# Patient Record
Sex: Male | Born: 1955 | Race: White | Hispanic: No | Marital: Married | Smoking: Former smoker
Health system: Southern US, Community
[De-identification: ages and names within clinical notes are randomized; demographics above are authoritative.]

## PROBLEM LIST (undated history)

## (undated) DIAGNOSIS — H9190 Unspecified hearing loss, unspecified ear: Secondary | ICD-10-CM

## (undated) DIAGNOSIS — I82409 Acute embolism and thrombosis of unspecified deep veins of unspecified lower extremity: Secondary | ICD-10-CM

## (undated) DIAGNOSIS — J449 Chronic obstructive pulmonary disease, unspecified: Secondary | ICD-10-CM

## (undated) DIAGNOSIS — J40 Bronchitis, not specified as acute or chronic: Secondary | ICD-10-CM

## (undated) DIAGNOSIS — I8390 Asymptomatic varicose veins of unspecified lower extremity: Secondary | ICD-10-CM

## (undated) DIAGNOSIS — I809 Phlebitis and thrombophlebitis of unspecified site: Secondary | ICD-10-CM

## (undated) HISTORY — PX: ELBOW SURGERY: SHX618

## (undated) HISTORY — DX: Phlebitis and thrombophlebitis of unspecified site: I80.9

## (undated) HISTORY — DX: Asymptomatic varicose veins of unspecified lower extremity: I83.90

## (undated) HISTORY — DX: Chronic obstructive pulmonary disease, unspecified: J44.9

## (undated) HISTORY — PX: OTHER SURGICAL HISTORY: SHX169

## (undated) HISTORY — DX: Unspecified hearing loss, unspecified ear: H91.90

## (undated) HISTORY — DX: Acute embolism and thrombosis of unspecified deep veins of unspecified lower extremity: I82.409

## (undated) HISTORY — DX: Bronchitis, not specified as acute or chronic: J40

---

## 2004-03-18 ENCOUNTER — Encounter: Admission: RE | Admit: 2004-03-18 | Discharge: 2004-03-18 | Payer: Self-pay | Admitting: Family Medicine

## 2006-03-05 ENCOUNTER — Ambulatory Visit: Payer: Self-pay | Admitting: Family Medicine

## 2006-04-23 ENCOUNTER — Encounter: Admission: RE | Admit: 2006-04-23 | Discharge: 2006-04-23 | Payer: Self-pay

## 2006-06-16 ENCOUNTER — Inpatient Hospital Stay (HOSPITAL_COMMUNITY): Admission: EM | Admit: 2006-06-16 | Discharge: 2006-06-19 | Payer: Self-pay

## 2006-06-16 ENCOUNTER — Ambulatory Visit: Payer: Self-pay | Admitting: Vascular Surgery

## 2006-06-16 ENCOUNTER — Ambulatory Visit (HOSPITAL_COMMUNITY): Admission: RE | Admit: 2006-06-16 | Discharge: 2006-06-16 | Payer: Self-pay | Admitting: General Surgery

## 2006-06-21 ENCOUNTER — Ambulatory Visit: Payer: Self-pay | Admitting: Family Medicine

## 2006-06-24 ENCOUNTER — Ambulatory Visit: Payer: Self-pay | Admitting: Family Medicine

## 2006-06-28 ENCOUNTER — Ambulatory Visit: Payer: Self-pay | Admitting: Family Medicine

## 2006-07-17 ENCOUNTER — Emergency Department (HOSPITAL_COMMUNITY): Admission: EM | Admit: 2006-07-17 | Discharge: 2006-07-17 | Payer: Self-pay | Admitting: Emergency Medicine

## 2006-07-22 ENCOUNTER — Ambulatory Visit: Payer: Self-pay | Admitting: Family Medicine

## 2006-09-16 ENCOUNTER — Ambulatory Visit: Payer: Self-pay | Admitting: Family Medicine

## 2006-10-27 ENCOUNTER — Ambulatory Visit: Payer: Self-pay | Admitting: Family Medicine

## 2006-12-17 ENCOUNTER — Ambulatory Visit: Payer: Self-pay | Admitting: Family Medicine

## 2007-02-07 ENCOUNTER — Ambulatory Visit: Payer: Self-pay | Admitting: Family Medicine

## 2007-02-10 ENCOUNTER — Ambulatory Visit: Payer: Self-pay | Admitting: Surgery

## 2007-02-10 ENCOUNTER — Ambulatory Visit (HOSPITAL_COMMUNITY): Admission: RE | Admit: 2007-02-10 | Discharge: 2007-02-10 | Payer: Self-pay | Admitting: Family Medicine

## 2007-02-10 ENCOUNTER — Encounter: Payer: Self-pay | Admitting: Family Medicine

## 2007-03-11 ENCOUNTER — Ambulatory Visit: Payer: Self-pay | Admitting: Family Medicine

## 2007-03-15 ENCOUNTER — Encounter: Payer: Self-pay | Admitting: Family Medicine

## 2007-03-15 ENCOUNTER — Ambulatory Visit: Payer: Self-pay | Admitting: Vascular Surgery

## 2007-03-15 ENCOUNTER — Ambulatory Visit (HOSPITAL_COMMUNITY): Admission: RE | Admit: 2007-03-15 | Discharge: 2007-03-15 | Payer: Self-pay | Admitting: Family Medicine

## 2007-03-15 ENCOUNTER — Ambulatory Visit: Payer: Self-pay | Admitting: Family Medicine

## 2007-03-23 ENCOUNTER — Ambulatory Visit: Payer: Self-pay | Admitting: Family Medicine

## 2007-04-07 ENCOUNTER — Ambulatory Visit: Payer: Self-pay | Admitting: Family Medicine

## 2007-04-25 ENCOUNTER — Ambulatory Visit: Payer: Self-pay | Admitting: Family Medicine

## 2007-05-02 ENCOUNTER — Ambulatory Visit: Payer: Self-pay | Admitting: Vascular Surgery

## 2007-05-09 ENCOUNTER — Ambulatory Visit: Payer: Self-pay | Admitting: Family Medicine

## 2007-05-25 ENCOUNTER — Ambulatory Visit: Payer: Self-pay | Admitting: Family Medicine

## 2007-06-10 ENCOUNTER — Ambulatory Visit: Payer: Self-pay | Admitting: Family Medicine

## 2007-07-08 ENCOUNTER — Ambulatory Visit: Payer: Self-pay | Admitting: Family Medicine

## 2007-08-08 ENCOUNTER — Ambulatory Visit: Payer: Self-pay | Admitting: Family Medicine

## 2007-08-22 ENCOUNTER — Ambulatory Visit: Payer: Self-pay | Admitting: Family Medicine

## 2007-09-06 ENCOUNTER — Ambulatory Visit: Payer: Self-pay | Admitting: Family Medicine

## 2007-09-21 ENCOUNTER — Ambulatory Visit: Payer: Self-pay | Admitting: Family Medicine

## 2007-10-17 ENCOUNTER — Ambulatory Visit: Payer: Self-pay | Admitting: Family Medicine

## 2007-12-20 ENCOUNTER — Ambulatory Visit: Payer: Self-pay | Admitting: Family Medicine

## 2008-01-23 ENCOUNTER — Ambulatory Visit: Payer: Self-pay | Admitting: Family Medicine

## 2008-02-27 ENCOUNTER — Ambulatory Visit: Payer: Self-pay | Admitting: Family Medicine

## 2008-03-05 ENCOUNTER — Ambulatory Visit: Payer: Self-pay | Admitting: Family Medicine

## 2008-03-05 ENCOUNTER — Encounter: Admission: RE | Admit: 2008-03-05 | Discharge: 2008-03-05 | Payer: Self-pay | Admitting: Family Medicine

## 2008-03-08 ENCOUNTER — Ambulatory Visit: Payer: Self-pay | Admitting: Family Medicine

## 2008-03-19 ENCOUNTER — Ambulatory Visit: Payer: Self-pay | Admitting: Family Medicine

## 2008-03-20 ENCOUNTER — Ambulatory Visit: Payer: Self-pay | Admitting: Vascular Surgery

## 2008-04-17 ENCOUNTER — Ambulatory Visit: Payer: Self-pay | Admitting: Family Medicine

## 2008-05-21 ENCOUNTER — Ambulatory Visit: Payer: Self-pay | Admitting: Family Medicine

## 2008-06-22 ENCOUNTER — Ambulatory Visit: Payer: Self-pay | Admitting: Family Medicine

## 2008-07-23 ENCOUNTER — Ambulatory Visit: Payer: Self-pay | Admitting: Family Medicine

## 2008-08-22 ENCOUNTER — Ambulatory Visit: Payer: Self-pay | Admitting: Family Medicine

## 2008-10-02 ENCOUNTER — Ambulatory Visit: Payer: Self-pay | Admitting: Family Medicine

## 2008-11-27 ENCOUNTER — Ambulatory Visit: Payer: Self-pay | Admitting: Family Medicine

## 2008-12-17 ENCOUNTER — Ambulatory Visit: Payer: Self-pay | Admitting: Family Medicine

## 2009-02-04 ENCOUNTER — Ambulatory Visit: Payer: Self-pay | Admitting: Family Medicine

## 2009-03-30 ENCOUNTER — Emergency Department (HOSPITAL_COMMUNITY): Admission: EM | Admit: 2009-03-30 | Discharge: 2009-03-30 | Payer: Self-pay | Admitting: Emergency Medicine

## 2009-04-01 ENCOUNTER — Ambulatory Visit: Payer: Self-pay | Admitting: Family Medicine

## 2009-04-16 ENCOUNTER — Ambulatory Visit: Payer: Self-pay | Admitting: Physician Assistant

## 2009-06-10 ENCOUNTER — Ambulatory Visit: Payer: Self-pay | Admitting: Family Medicine

## 2009-07-16 ENCOUNTER — Ambulatory Visit: Payer: Self-pay | Admitting: Family Medicine

## 2009-08-13 ENCOUNTER — Ambulatory Visit: Payer: Self-pay | Admitting: Family Medicine

## 2009-09-10 ENCOUNTER — Ambulatory Visit: Payer: Self-pay | Admitting: Family Medicine

## 2009-10-09 ENCOUNTER — Encounter: Admission: RE | Admit: 2009-10-09 | Discharge: 2009-10-09 | Payer: Self-pay | Admitting: Family Medicine

## 2009-10-09 ENCOUNTER — Ambulatory Visit: Payer: Self-pay | Admitting: Family Medicine

## 2009-10-09 ENCOUNTER — Encounter: Payer: Self-pay | Admitting: Internal Medicine

## 2009-12-25 ENCOUNTER — Ambulatory Visit: Payer: Self-pay | Admitting: Family Medicine

## 2010-01-14 ENCOUNTER — Ambulatory Visit: Payer: Self-pay | Admitting: Family Medicine

## 2010-03-03 ENCOUNTER — Ambulatory Visit: Payer: Self-pay | Admitting: Family Medicine

## 2010-03-12 ENCOUNTER — Ambulatory Visit: Payer: Self-pay | Admitting: Family Medicine

## 2010-04-14 ENCOUNTER — Ambulatory Visit: Payer: Self-pay | Admitting: Family Medicine

## 2010-04-16 ENCOUNTER — Encounter: Payer: Self-pay | Admitting: Internal Medicine

## 2010-04-16 ENCOUNTER — Encounter
Admission: RE | Admit: 2010-04-16 | Discharge: 2010-04-16 | Payer: Self-pay | Source: Home / Self Care | Attending: Family Medicine | Admitting: Family Medicine

## 2010-05-06 ENCOUNTER — Encounter: Payer: Self-pay | Admitting: Internal Medicine

## 2010-05-06 ENCOUNTER — Ambulatory Visit
Admission: RE | Admit: 2010-05-06 | Discharge: 2010-05-06 | Payer: Self-pay | Source: Home / Self Care | Attending: Family Medicine | Admitting: Family Medicine

## 2010-05-06 ENCOUNTER — Encounter
Admission: RE | Admit: 2010-05-06 | Discharge: 2010-05-06 | Payer: Self-pay | Source: Home / Self Care | Attending: Family Medicine | Admitting: Family Medicine

## 2010-05-19 ENCOUNTER — Emergency Department (HOSPITAL_COMMUNITY)
Admission: EM | Admit: 2010-05-19 | Discharge: 2010-05-20 | Payer: Self-pay | Source: Home / Self Care | Admitting: Emergency Medicine

## 2010-05-20 LAB — BASIC METABOLIC PANEL
BUN: 14 mg/dL (ref 6–23)
CO2: 25 mEq/L (ref 19–32)
Chloride: 108 mEq/L (ref 96–112)
Creatinine, Ser: 1.02 mg/dL (ref 0.4–1.5)
GFR calc Af Amer: >60 mL/min (ref >60)
GFR calc non Af Amer: >60 mL/min (ref >60)
Sodium: 143 mEq/L (ref 135–145)

## 2010-05-20 LAB — CBC
HCT: 45 % (ref 39.0–52.0)
MCHC: 33.8 g/dL (ref 30.0–36.0)
MCV: 87.4 fL (ref 78.0–100.0)
RDW: 12.8 % (ref 11.5–15.5)

## 2010-05-20 LAB — DIFFERENTIAL
Basophils Absolute: 0.1 10*3/uL (ref 0.0–0.1)
Basophils Relative: 1 % (ref 0–1)
Eosinophils Relative: 2 % (ref 0–5)
Lymphocytes Relative: 21 % (ref 12–46)
Lymphs Abs: 2.6 10*3/uL (ref 0.7–4.0)
Monocytes Absolute: 1 10*3/uL (ref 0.1–1.0)
Neutrophils Relative %: 69 % (ref 43–77)

## 2010-05-20 LAB — OCCULT BLOOD, POC DEVICE: Fecal Occult Bld: NEGATIVE

## 2010-06-23 ENCOUNTER — Ambulatory Visit (INDEPENDENT_AMBULATORY_CARE_PROVIDER_SITE_OTHER): Payer: BC Managed Care – PPO

## 2010-06-23 DIAGNOSIS — Z7901 Long term (current) use of anticoagulants: Secondary | ICD-10-CM

## 2010-07-03 ENCOUNTER — Encounter: Payer: Self-pay | Admitting: Internal Medicine

## 2010-07-03 ENCOUNTER — Institutional Professional Consult (permissible substitution) (INDEPENDENT_AMBULATORY_CARE_PROVIDER_SITE_OTHER): Payer: BC Managed Care – PPO | Admitting: Internal Medicine

## 2010-07-03 DIAGNOSIS — J42 Unspecified chronic bronchitis: Secondary | ICD-10-CM | POA: Insufficient documentation

## 2010-07-03 DIAGNOSIS — J309 Allergic rhinitis, unspecified: Secondary | ICD-10-CM

## 2010-07-06 DIAGNOSIS — Z77090 Contact with and (suspected) exposure to asbestos: Secondary | ICD-10-CM | POA: Insufficient documentation

## 2010-07-15 NOTE — Assessment & Plan Note (Signed)
Summary: SOB/ WHEEZING- REFER DR: Toniann Fail ///kp   Primary Provider/Referring Provider:  lalonde  CC:  Pulmonary Consult-SOB/wheezing; cough-clear; Dr. Susann Givens.Marland Kitchen  History of Present Illness: July 03, 2010- 55 yoM former smoker, seen w/ wife at kind request of Dr Susann Givens with complaint of cough x 9 months. He dates onset to an outpatient pneumonia in June, 2011. CXR 10/09/09-RUL infiltrate c/w pneumonia. Incidental small RLL nodule. CXR 04/16/10- clear; CXR 05/06/10- acute bronchitis.  Cough was intermittent after the pneumonia and largely resolved, but resumed in December at time of bronchitis changes on CXR in January. Phlegm usually clear, sometimes green, w/ no blood. Occasional wheeze. Z pak and tussionex temporary help. Denies GERD. 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 He avoids strong cleansers and odors where possible. Proair helps some. Denies chest pain, palpitation, change in exercise tolerance, fever, weight loss, nodes. Was cross country runner -remote. Deaf- reads lips. Rhinitis cutting grass- itch and sneeze, but not cough or wheeze.  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  Hx DVT w/o known PE, R leg, 2006. Says hypercoag/ Leiden w/u negative. Had vein strrippings. Continues Coumadin- no bleeding.   Preventive Screening-Counseling & Management  Alcohol-Tobacco     Alcohol drinks/day: 0     Smoking Status: quit     Packs/Day: 3.0     Year Started: 1992     Year Quit: 1994     Pack years: 6  Current Medications (verified): 1)  Proair Hfa 108 (90 Base) Mcg/act Aers (Albuterol Sulfate) .... 2 Puffs Four Times A Day As Needed For Sob 2)  Coumadin 7.5 Mg Tabs (Warfarin Sodium) .... Take 1 By Mouth On Tuesday, Thursday, Saturday, and "Sunday 3)  Alprazolam 0.5 Mg Tabs (Alprazolam) .... Take 1 By Mouth Once Daily As Needed  Allergies (verified): 1)  !  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 Passive exposure till wife quit                                                                     20" 10 No ETOHSmoking Status:  quit Alcohol drinks/day:  0 Packs/Day:  3.0 Pack years:  6  Review of Systems      See HPI       The patient complains of shortness of breath with activity, shortness of breath at rest, productive cough, non-productive cough, sore throat, and change in color of mucus.  The patient denies coughing up blood, chest pain, irregular heartbeats, acid heartburn, indigestion, loss of appetite, weight change, abdominal pain, difficulty swallowing, tooth/dental problems, headaches, nasal congestion/difficulty breathing through nose, sneezing, itching, ear ache, anxiety, depression, hand/feet swelling, joint stiffness or pain, rash, and fever.    Vital Signs:  Patient profile:   55  year old male Height:      75 inches Weight:      289 pounds BMI:     36.25 O2 Sat:      92 % on Room air Pulse rate:   61 / minute BP sitting:   132 / 84  (left arm) Cuff size:   large  Vitals Entered By: Reynaldo Minium CMA (July 03, 2010 9:07 AM)  O2 Flow:  Room air CC: Pulmonary Consult-SOB/wheezing; cough-clear; Dr. Susann Givens.   Physical Exam  Additional Exam:  General: A/Ox3; pleasant and cooperative, NAD, deaf- lip reader, overweight SKIN: no rash, lesions; tatoos NODES: no lymphadenopathy HEENT: [place]/AT, EOM- WNL, Conjuctivae- clear, PERRLA, TM-WNL, Nose- clear, Throat- clear and wnl, Mallampati   II NECK: Supple w/ fair ROM, JVD- none, normal carotid impulses w/o bruits Thyroid- normal to palpation CHEST: Clear to P&A HEART: RRR, no m/g/r heard ABDOMEN: Soft and nl; nml bowel sounds; no organomegaly or masses noted XBJ:YNWG, nl pulses,, Heavy legs, elastic hose  NEURO: Grossly intact to observation  -    Impression & Recommendations:  Problem # 1:  BRONCHITIS, CHRONIC (ICD-491.9)  Main trigger seems to be strong odors. 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 He has a hx of asbestos exposure , but nothing described of concern on CXR. To start, we will get PFT and try Advair with med talk.  Fortunately his wife has stopped smoking.  Problem # 2:  ALLERGIC RHINITIS (ICD-477.9)  He recognizes the rhinitis pattern, not bad, and he and his wife deny an obvious relation to his cough pattern. Watch this as a mild seasonal rhinitis.  Medications Added to Medication List This Visit: 1)  Proair Hfa 108 (90 Base) Mcg/act Aers (Albuterol sulfate) .... 2 puffs four times a day as needed for sob 2)  Coumadin 7.5 Mg Tabs (Warfarin sodium) .... Take 1 by mouth on tuesday, thursday, saturday, and sunday 3)  Alprazolam 0.5 Mg Tabs (Alprazolam) .... Take 1 by mouth once daily as needed 4)  Advair Diskus 250-50 Mcg/dose Aepb (Fluticasone-salmeterol) .Marland Kitchen.. 1 puff and rinse mouth, twice every day  Other Orders: Consultation Level IV (95621)  Patient Instructions: 1)  Please schedule a follow-up appointment in 1 month. 2)  Schedule PFT 3)  Sample/ script Advair 250/50 4)     1 puff and rinsemouth well, twice daily every day 5)  Use the Proair rescue inhaler 2 puffs, up to 4 times daily when needed 6)  a1AT test 7)  cc Dr Susann Givens  Prescriptions: ADVAIR DISKUS 250-50 MCG/DOSE AEPB (FLUTICASONE-SALMETEROL) 1 puff and rinse mouth, twice every day  #1 x prn   Entered and Authorized by:   Waymon Budge MD   Signed by:   Waymon Budge MD on  07/03/2010   Method used:   Print then Give to Patient   RxID:   [id number]

## 2010-07-29 LAB — PROTIME-INR: Prothrombin Time: 28.7 seconds — ABNORMAL HIGH (ref 11.6–15.2)

## 2010-07-29 LAB — URINE CULTURE
Colony Count: NO GROWTH
Culture: NO GROWTH

## 2010-07-29 LAB — POCT I-STAT, CHEM 8
Chloride: 104 mEq/L (ref 96–112)
Creatinine, Ser: 0.9 mg/dL (ref 0.4–1.5)
Glucose, Bld: 132 mg/dL — ABNORMAL HIGH (ref 70–99)
HCT: 47 % (ref 39.0–52.0)
Potassium: 3.9 mEq/L (ref 3.5–5.1)

## 2010-07-29 LAB — DIFFERENTIAL
Eosinophils Relative: 2 % (ref 0–5)
Lymphocytes Relative: 25 % (ref 12–46)
Lymphs Abs: 1.7 10*3/uL (ref 0.7–4.0)
Monocytes Absolute: 0.5 10*3/uL (ref 0.1–1.0)

## 2010-07-29 LAB — CBC
MCHC: 34.8 g/dL (ref 30.0–36.0)
MCV: 87.3 fL (ref 78.0–100.0)
Platelets: 166 10*3/uL (ref 150–400)

## 2010-07-29 LAB — POCT CARDIAC MARKERS
CKMB, poc: 4.9 ng/mL (ref 1.0–8.0)
CKMB, poc: 5.1 ng/mL (ref 1.0–8.0)
Troponin i, poc: <0.05 ng/mL (ref 0.00–0.09)

## 2010-07-29 LAB — URINALYSIS, ROUTINE W REFLEX MICROSCOPIC
Bilirubin Urine: NEGATIVE
Glucose, UA: NEGATIVE mg/dL
Hgb urine dipstick: NEGATIVE
Specific Gravity, Urine: 1.04 — ABNORMAL HIGH (ref 1.005–1.030)
Urobilinogen, UA: 0.2 mg/dL (ref 0.0–1.0)

## 2010-07-29 LAB — COMPREHENSIVE METABOLIC PANEL
AST: 36 U/L (ref 0–37)
Albumin: 4.2 g/dL (ref 3.5–5.2)
Calcium: 9 mg/dL (ref 8.4–10.5)
Creatinine, Ser: 1.08 mg/dL (ref 0.4–1.5)
GFR calc Af Amer: >60 mL/min (ref >60)

## 2010-07-30 ENCOUNTER — Encounter: Payer: Self-pay | Admitting: Internal Medicine

## 2010-08-11 ENCOUNTER — Encounter: Payer: Self-pay | Admitting: Internal Medicine

## 2010-08-12 ENCOUNTER — Ambulatory Visit (INDEPENDENT_AMBULATORY_CARE_PROVIDER_SITE_OTHER): Payer: BC Managed Care – PPO | Admitting: Internal Medicine

## 2010-08-12 ENCOUNTER — Encounter: Payer: Self-pay | Admitting: Internal Medicine

## 2010-08-12 VITALS — BP 148/90 | HR 83 | Ht 75.0 in | Wt 289.0 lb

## 2010-08-12 DIAGNOSIS — Z77090 Contact with and (suspected) exposure to asbestos: Secondary | ICD-10-CM

## 2010-08-12 DIAGNOSIS — J42 Unspecified chronic bronchitis: Secondary | ICD-10-CM

## 2010-08-12 LAB — PULMONARY FUNCTION TEST

## 2010-08-12 NOTE — Assessment & Plan Note (Signed)
Subacute bronchitis with cough. Season, time after an initial viral bronchitis, or the Advair may have made the difference. I suggest that he finish up his Advair using it once daily. Then stop. He can hold the rescue inhaler in case needed. If the cough returns, he can restart Advair. i will see him back as needed.

## 2010-08-12 NOTE — Patient Instructions (Addendum)
Use up the Advair, one puff once daily, then stop. If the cough becomes a problem again it can be restarted. Use the red rescue inhaler only if needed.  I will be happy to see you again if I can be helpful.      Cc Dr Susann Givens

## 2010-08-12 NOTE — Progress Notes (Signed)
PFT done today. 

## 2010-08-12 NOTE — Progress Notes (Signed)
  Subjective:    Patient ID: William Martinez, male    DOB: 01-Jul-1955, 55 y.o.   MRN: [id number]  HPI 80 yoM former smoker  (Hearing Impaired)  followed for bronchitis and allergic rhinitis with hx asbestos exposure, hx DVT/ Leiden NEG/ coumadin.  Last here- July 02, 2009 at request of Dr Susann Givens complaining of cough x 9 months after a pneumonia.Marland Kitchen  PFT today reviewed with him- normal. FEV1/ FVC 0.78. Slight small airway response to bronchodilator. He hasn't been using his rescue inhaler but has been using his Advair once daily with no cough.   Review of Systems See HPI Constitutional:   No weight loss, night sweats,  Fevers, chills, fatigue, lassitude. HEENT:   No headaches,  Difficulty swallowing,  Tooth/dental problems,  Sore throat,                No sneezing, itching, ear ache, nasal congestion, post nasal drip,   CV:  No chest pain,  Orthopnea, PND, swelling in lower extremities, anasarca, dizziness, palpitations  GI  No heartburn, indigestion, abdominal pain, nausea, vomiting, diarrhea, change in bowel habits, loss of appetite  Resp: No shortness of breath with exertion or at rest.  No excess mucus, no productive cough,  No non-productive cough,  No coughing up of blood.  No change in color of mucus.  No wheezing.  Skin: no rash or lesions.  GU: no dysuria, change in color of urine, no urgency or frequency.  No flank pain.  MS:  No joint pain or swelling.  No decreased range of motion.  No back pain.  Psych:  No change in mood or affect. No depression or anxiety.  No memory loss.     Objective:   Physical Exam General- Alert, Oriented, Affect-appropriate, Distress- none acute     Deaf- Reads Lips  Skin- rash-none, lesions- none, excoriation- none.    Many tatoos  Lymphadenopathy- none  Head- atraumatic  Eyes- Gross vision intact, PERRLA, conjunctivae clear secretions  Ears- Hearing--- Impaired-- Mostly reads lips. ,   Nose- Clear,  No Septal dev, mucus, polyps, erosion,  perforation   Throat- Mallampati II , mucosa clear , drainage- none, tonsils- atrophic  Neck- flexible , trachea midline, no stridor , thyroid nl, carotid no bruit  Chest - symmetrical excursion , unlabored     Heart/CV- RRR , no murmur , no gallop  , no rub, nl s1 s2                     - JVD- none , edema- none, stasis changes- none, varices- none     Lung- clear to P&A,  wheeze- none, cough- none , dullness-none, rub- none     Chest wall-  Abd- tender-no, distended-no, bowel sounds-present, HSM- no  Br/ Gen/ Rectal- Not done, not indicated  Extrem- cyanosis- none, clubbing, none, atrophy- none, strength- nl  Neuro- grossly intact to observation         Assessment & Plan:

## 2010-08-17 ENCOUNTER — Encounter: Payer: Self-pay | Admitting: Internal Medicine

## 2010-08-19 ENCOUNTER — Encounter: Payer: Self-pay | Admitting: Internal Medicine

## 2010-08-22 NOTE — Progress Notes (Signed)
Quick Note:  Left Message to call back-cell number is D/C. ______

## 2010-08-28 NOTE — Progress Notes (Signed)
Quick Note:  Spoke with wife(Liz) she will inform patient of results and if he should have any questions then he is to call me at the office. ______

## 2010-09-08 ENCOUNTER — Other Ambulatory Visit: Payer: BC Managed Care – PPO

## 2010-09-08 DIAGNOSIS — Z7901 Long term (current) use of anticoagulants: Secondary | ICD-10-CM

## 2010-09-08 NOTE — Progress Notes (Signed)
Addended by: Winfield Rast on: 09/08/2010 08:12 AM   Modules accepted: Orders

## 2010-09-09 ENCOUNTER — Telehealth: Payer: Self-pay

## 2010-09-09 NOTE — Assessment & Plan Note (Signed)
OFFICE VISIT   William Martinez, William Martinez  DOB:  11-04-1955                                       03/20/2008  WUJWJ#:[id number]   The patient is a 55 year old male patient who I evaluated in January of  this year for a history of venous thrombosis which is well documented in  his consultation note dated January of 2009.  He has had multiple  episodes of DVT in the right leg and has been on and off Coumadin, most  recently having the Coumadin discontinued in October of this year.  Within 30 days he developed superficial thrombophlebitis in his right  leg beginning in his foot and ankle area extending into the lateral  thigh and then into the medial thigh into the greater saphenous vein.  Venous duplex exam at [facility] on November 9 of this year  revealed right greater saphenous thrombophlebitis but no DVT.  He has  been back on the Coumadin and Lovenox which he has been taking since  that diagnosis was made and his INR is now 2.6 and he will discontinue  his Lovenox after today.  He has never had a pulmonary embolus and has  been apparently ruled out for factor V Leiden deficiency.  He has no  history of thrombotic problems in the left leg.   On physical exam blood pressure 141/88, heart rate 60, respirations 14.  He has an easily palpable thrombosed in a varix emanating from the great  saphenous vein in the proximal third of the right thigh.  There is no  differential of edema in the right to left leg distally.  Homan sign is  negative.  There are varicosities in the lateral distal thigh and calf  which are not thrombosed.  He has 3+ dorsalis pedis pulses bilaterally.   I think that it would be okay to discontinue Lovenox now and I would  continue Coumadin on a permanent basis since every time it is  discontinued he develops a venous thrombotic episode.  He does not need  an IVC filter at this time unless he has thrombotic episodes occurring  while  anticoagulated which he has never had in the past.  I would be  happy to see him again in the future as necessary.   Quita Skye Hart Rochester, M.D.  Electronically Signed   JDL/MEDQ  D:  03/20/2008  T:  03/21/2008  Job:  [id number]   cc:   Sharlot Gowda, M.D.  Beatris Ship

## 2010-09-09 NOTE — Telephone Encounter (Signed)
Talked with liz pt needs to watch greens intake recheck in 1 month

## 2010-09-09 NOTE — Consult Note (Signed)
VASCULAR SURGERY CONSULTATION   William Martinez, William Martinez  DOB:  1955-09-06                                       05/02/2007  CHART#:[id number]   Patient is a 55 year old male patient referred for vascular surgery  consultation by Dr. Thane Edu for recurrent venous disease in  the right lower extremity.  This gentleman has a history of bilateral  lower extremity vein strippings performed in [place] about 14 years  ago.  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 Orson Slick in January, 2008.  He  developed deep venous thrombosis in the calf veins in February, 2008  about three weeks postoperatively with thrombus noted in the tibial  peroneal trunk and gastrocnemius vein and was treated with Coumadin.  This was continued for four months, at which time it was discontinued,  and in October, 2008 developed increasing discomfort in the right leg  with some increasing swelling and had a documented thrombophlebitis in  the superficial femoral veins and the lateral aspect of the right leg in  November, 2008.  He was resumed on the Coumadin therapy, which he has  now continued.  He has taken 10 mg per day with an INR of 3.7.  He  apparently has had at least a portion of a thrombotic workup with  negative tests for Factor V Leiden deficiency.  He had a repeat venous  duplex exam performed in November, 2008 which revealed chronic calf vein  thrombosis and some superficial thrombophlebitis, as described, but no  change in his deep venous system,  compared to previous studies.  He has  no history of pulmonary emboli or deep venous thrombosis in the  contralateral left leg.  He has been having some persistent discomfort  and swelling and is referred for further evaluation.   PAST MEDICAL HISTORY:  Negative for diabetes, coronary artery disease,  hypertension, hyperlipidemia, stroke, or COPD.   PAST SURGICAL HISTORY:  1.  Right elbow surgery.  2. Bilateral ligation and stripping of varicose veins.  3. Right powered phlebectomy for varicosities.  4. Family history is positive for stroke in his mother.  Positive for      cancer and coronary artery disease in his father.  Positive for      diabetes in his sister.   SOCIAL HISTORY:  He is married and has three children and works as a  Arboriculturist.  Quit smoking 12-13 years ago and does not use alcohol.   REVIEW OF SYSTEMS:  Please see health history form.   MEDICATIONS:  Please see health history form.  This includes Coumadin 10  mg per day.   ALLERGIES:  PERCOCET.   PHYSICAL EXAMINATION:  Blood pressure is 137/96.  Heart rate is 95.  Respirations are 18.  Generally, he is a middle-aged male who does not  hear well but expresses himself well.  His neck is supple with 3+  carotid pulses palpable.  No palpable adenopathy is noted in the neck.  Neurologic exam is normal.  He is in no apparent distress.  Chest:  Clear to auscultation.  Cardiovascular:  Regular rhythm with no murmurs.  Abdomen is soft and nontender with no palpable masses.  He has 3+  femoral, popliteal, and dorsalis pedis pulses bilaterally.  Right leg is  slightly swollen, compared to the  left, with 1 cm in the calf and 1 cm  in the thigh, large in circumference.  There are some thrombosed  varicosities in the lateral aspect of the leg around the knee and of the  lateral pretibial and into the lateral thigh, which are not red, tender,  or warm, but the thrombus is palpable and chronic in nature.  He has no  stasis ulcers or evidence of infection.  He has palpable arterial pulses  bilaterally.   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  Since he has had recurrence of his symptoms, I would  recommend  chronic Coumadin therapy and elastic compression stockings to  improve the swelling as well as elevation of the legs at night.  I do  not have any further recommendations to make, and I think he is being  treated appropriately for this.  If I can be of further assistance, I  would be happy to.   Quita Skye Hart [place], M.D.  Electronically Signed  JDL/MEDQ  D:  05/02/2007  T:  05/03/2007  Job:  [id number]   cc:   Thane Edu

## 2010-09-12 NOTE — H&P (Signed)
William Martinez, SEK               ACCOUNT NO.:  [id number]   MEDICAL RECORD NO.:  [id number]          PATIENT TYPE:  OUT   LOCATION:  VASC                         FACILITY:  [facility]   PHYSICIAN:  Angelia Mould. Derrell Lolling, M.D.DATE OF BIRTH:  1955/10/15   DATE OF ADMISSION:  06/16/2006  DATE OF DISCHARGE:                              HISTORY & PHYSICAL   CHIEF COMPLAINT:  Pain and swelling, right calf.   HISTORY OF PRESENT ILLNESS:  This is a 55 year old white man who  underwent an excision of varicose veins by transilluminated power  phlebectomy by Dr. Lebron Conners on May 21, 2006.  He states that  he has had right calf swelling and discomfort ever since the surgery.  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  He apparently has been evaluated previously for venous  thrombosis, and that was negative.  He came in today because he still  has pain and swelling.  He was seen in the office.  He was sent for  venous Dopplers.   The venous Dopplers performed at [facility] lab showed superficial venous  thrombosis in the greater saphenous vein in the thigh, superficial veins  in the distal lateral thigh and lateral calf.  He also has a 3 inches x  1 inch hematoma in the proximal lateral right calf.  He also has deep  venous thrombosis in the gastrocnemius vein just associated with the  popliteal vein, but there is no thrombus in the popliteal or femoral  veins.  He is being admitted for bed rest, elevation and  anticoagulation.   PAST HISTORY:  The is in good health otherwise.  He has had some vein  surgery and thrombophlebitis in both legs in the past.   CURRENT MEDICATIONS:  None.  He takes Ultram p.r.n.   DRUG ALLERGIES:  He had a psychological reaction to Percocet, but no  medicine allergies.   SOCIAL HISTORY:  He is married.  He does not smoke.  Drinks alcohol  beverages occasionally.   FAMILY HISTORY:  Father deceased of some type of cancer.   Mother died of  old age.  Brother and sister deceased; had diabetes.   REVIEW OF SYSTEMS:  All systems reviewed.  A 15 system review of systems  is noncontributory except as described above.   PHYSICAL EXAMINATION:  GENERAL:  A pleasant middle-aged man in minimal  distress.  VITAL SIGNS:  Temperature 97.3, heart rate 80.  HEENT:  Eyes - sclerae clear.  Extraocular movements intact.  Ears,  nose, mouth and throat:  Nose, lips, tongue and oropharynx without gross  lesions.  NECK:  Supple, nontender.  No mass.  LUNGS:  Clear to auscultation.  No chest wall tenderness.  No rubs or  rhonchi.  HEART:  Regular rate and rhythm.  No murmur, no ectopy.  ABDOMEN:  Soft and nontender.  EXTREMITIES:  Left lower extremity is unremarkable.  Right lower  extremity shows multiple healed incisions on the thighs and calves.  The  calf is somewhat swollen slightly more than  the left calf.  It is  minimally tender.  There is no real cellulitis, but is somewhat firm.  Homans sign is negative.  There is no sign of any superficial inflamed  cords.   ASSESSMENT:  1. Deep venous thrombosis of the deep gastrocnemius vein, right leg.  2. Multiple superficial venous thromboses.  3. A 3 inch x 1 inch hematoma of the proximal lateral calf, doubt      infection.  4. Status post TIPS procedure, right leg, May 21, 2006.   PLAN:  Admit for bed rest, elevation, anti-inflammatory medication and  anticoagulation.   Anticipate that if this stabilizes over 24-48 hours, he can be  discharged on Lovenox as an outpatient.      Angelia Mould. Derrell Lolling, M.D.  Electronically Signed     HMI/MEDQ  D:  06/16/2006  T:  06/17/2006  Job:  [id number]

## 2010-09-12 NOTE — Discharge Summary (Signed)
William Martinez, William Martinez               ACCOUNT NO.:  [id number]   MEDICAL RECORD NO.:  [id number]          PATIENT TYPE:  INP   LOCATION:  5702                         FACILITY:  [facility]   PHYSICIAN:  Angelia Mould. Derrell Lolling, M.D.DATE OF BIRTH:  07/15/1955   DATE OF ADMISSION:  06/16/2006  DATE OF DISCHARGE:  06/19/2006                               DISCHARGE SUMMARY   FINAL DIAGNOSES:  1. Deep venous thrombosis at the deep gastrocnemius vein right leg  2. Multiple superficial venous thromboses.  3. Hematoma of the proximal lateral calf.  4. Status post TIPS procedure, right leg, May 21, 2006.   OPERATION PERFORMED:  None.   HISTORY OF PRESENT ILLNESS:  This is a 55 year old white man who  underwent excision of varicose veins by transilluminated power  phlebectomy by Dr. Lebron Conners May 21, 2006.  He has had right  calf swelling and discomfort over since of surgery.  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  He came into the  office on the day of admission because of pain and swelling and was sent  for venous Dopplers.  The venous Dopplers at the [facility] labs showed  superficial venous thrombosis in the greater saphenous veins in the  thighs and superficial veins in the distal lateral thigh and lateral  calf.  He also had a 3 inches x 1 inch hematoma of the proximal lateral  calf.  He also had a focal deep venous thrombosis of the gastrocnemius  vein just next to the popliteal vein.  There was no problems in the  popliteal or femoral veins.  It was felt to be in his best interest for  admission for anticoagulation and elevation.   PHYSICAL EXAMINATION:  GENERAL:  Pleasant middle-aged man, minimal  distress.  VITAL SIGNS:  Temperature 97.3, heart rate 80.  EXTREMITIES:  The left lower extremity was unremarkable, but the right  lower extremity showed multiple healed incisions on the thighs and  calves.  The calf was somewhat swollen slightly more than  the left calf  but minimally tender.  There was no real cellulitis, but it was somewhat  firm.  Homan's sign was negative.  No sign of any superficial inflamed  cords.   HOSPITAL COURSE:  The patient was seen by me in the office, and I then  sent him for the venous Dopplers which were as described above.  By  phone, he was admitted to the hospital, and I called my partner Dr. Avel Peace who was managing the surgical hospitalist service at the  time.  The patient was anticoagulated on Lovenox, and he was started on  Coumadin.  His ambulation was restricted at first, but then he began to  feel better.  His leg remained somewhat edematous, but the discomfort  became better.   Case management saw him to assist with outpatient resources.  Finally,  case management was able to make arrangements for outpatient Lovenox 100  mg subcu q.12 h and Coumadin 7.5 mg daily at home.  Home health nursing  was to  check his Prothrombin time and INR on the following Monday, and  the patient was then arranged to follow up with Dr. Sharlot Gowda for  ongoing management of his anticoagulation.      Angelia Mould. Derrell Lolling, M.D.  Electronically Signed     HMI/MEDQ  D:  10/05/2006  T:  10/06/2006  Job:  [id number]   cc:   Sharlot Gowda, M.D.

## 2010-09-29 ENCOUNTER — Other Ambulatory Visit: Payer: Self-pay | Admitting: Family Medicine

## 2010-10-10 ENCOUNTER — Ambulatory Visit (INDEPENDENT_AMBULATORY_CARE_PROVIDER_SITE_OTHER): Payer: BC Managed Care – PPO | Admitting: Family Medicine

## 2010-10-10 DIAGNOSIS — J029 Acute pharyngitis, unspecified: Secondary | ICD-10-CM

## 2010-10-10 DIAGNOSIS — Z7901 Long term (current) use of anticoagulants: Secondary | ICD-10-CM

## 2010-10-10 LAB — PROTIME-INR
INR: 2.17 — ABNORMAL HIGH (ref <1.50)
Prothrombin Time: 24.9 seconds — ABNORMAL HIGH (ref 11.6–15.2)

## 2010-10-10 LAB — POCT RAPID STREP A (OFFICE): Rapid Strep A Screen: NEGATIVE

## 2010-10-10 NOTE — Progress Notes (Signed)
  Subjective:    Patient ID: William Martinez, male    DOB: [birth year], 55 y.o.   MRN: [id number]  HPI He has a one-day history of sore throat, sneezing, nasal congestion coughing no fever, chills or earache. Doesn't smoke.   Review of Systems     Objective:   Physical Examalert and in no distress. Tympanic membranes and canals are normal. Throat is clear. Tonsils are normal. Neck is supple without adenopathy or thyromegaly. Cardiac exam shows a regular sinus rhythm without murmurs or gallops. Lungs are clear to auscultation. Strep test is negative         Assessment & Plan:  Viral pharyngitis. Supportive care. Call if further trouble. His usual PT/INR will be ordered.

## 2010-10-13 ENCOUNTER — Telehealth: Payer: Self-pay

## 2010-10-13 NOTE — Telephone Encounter (Signed)
William Martinez was informed to continue same dose

## 2010-10-27 ENCOUNTER — Other Ambulatory Visit: Payer: Self-pay | Admitting: Family Medicine

## 2010-10-27 NOTE — Telephone Encounter (Signed)
I have called med in 

## 2010-10-27 NOTE — Telephone Encounter (Signed)
Is this ok?

## 2010-10-27 NOTE — Telephone Encounter (Signed)
Renew the medicine 

## 2010-11-06 ENCOUNTER — Other Ambulatory Visit: Payer: Self-pay

## 2010-11-06 MED ORDER — WARFARIN SODIUM 10 MG PO TABS: 10.0000 mg | ORAL_TABLET | Freq: Every day | ORAL | Status: DC

## 2010-12-19 ENCOUNTER — Telehealth: Payer: Self-pay

## 2010-12-19 ENCOUNTER — Other Ambulatory Visit: Payer: BC Managed Care – PPO

## 2010-12-19 DIAGNOSIS — Z7901 Long term (current) use of anticoagulants: Secondary | ICD-10-CM

## 2010-12-19 LAB — PROTIME-INR
INR: 1.94 — ABNORMAL HIGH (ref <1.50)
Prothrombin Time: 22.3 seconds — ABNORMAL HIGH (ref 11.6–15.2)

## 2010-12-19 NOTE — Telephone Encounter (Signed)
Informed pt to continue on present med

## 2011-01-15 ENCOUNTER — Other Ambulatory Visit (INDEPENDENT_AMBULATORY_CARE_PROVIDER_SITE_OTHER): Payer: BC Managed Care – PPO

## 2011-01-15 DIAGNOSIS — Z7901 Long term (current) use of anticoagulants: Secondary | ICD-10-CM

## 2011-01-15 DIAGNOSIS — Z23 Encounter for immunization: Secondary | ICD-10-CM

## 2011-01-16 ENCOUNTER — Telehealth: Payer: Self-pay | Admitting: *Deleted

## 2011-01-16 NOTE — Telephone Encounter (Addendum)
Message copied by Dorthula Perfect on Fri Jan 16, 2011  8:04 AM ------      Message from: Ronnald Nian      Created: Thu Jan 15, 2011  5:24 PM       PT/INR looks good continue on present medication and recheck one month.   Pt notified of lab results and will return in one month.  CM,LPN

## 2011-01-28 ENCOUNTER — Other Ambulatory Visit: Payer: Self-pay | Admitting: Family Medicine

## 2011-01-28 NOTE — Telephone Encounter (Signed)
Is this ok?

## 2011-01-29 NOTE — Telephone Encounter (Signed)
Dr. Lalonde took care of this 

## 2011-02-02 ENCOUNTER — Telehealth: Payer: Self-pay | Admitting: Family Medicine

## 2011-02-02 NOTE — Telephone Encounter (Signed)
Phone Xanax to [employer]

## 2011-03-02 ENCOUNTER — Other Ambulatory Visit: Payer: Self-pay | Admitting: *Deleted

## 2011-03-02 ENCOUNTER — Encounter: Payer: Self-pay | Admitting: Vascular Surgery

## 2011-03-02 DIAGNOSIS — I8290 Acute embolism and thrombosis of unspecified vein: Secondary | ICD-10-CM

## 2011-03-03 ENCOUNTER — Other Ambulatory Visit: Payer: Self-pay | Admitting: *Deleted

## 2011-03-03 ENCOUNTER — Other Ambulatory Visit (INDEPENDENT_AMBULATORY_CARE_PROVIDER_SITE_OTHER): Payer: BC Managed Care – PPO | Admitting: *Deleted

## 2011-03-03 ENCOUNTER — Ambulatory Visit (INDEPENDENT_AMBULATORY_CARE_PROVIDER_SITE_OTHER): Payer: BC Managed Care – PPO | Admitting: Vascular Surgery

## 2011-03-03 ENCOUNTER — Encounter: Payer: Self-pay | Admitting: Vascular Surgery

## 2011-03-03 VITALS — BP 163/112 | HR 62 | Resp 16 | Ht 75.0 in | Wt 272.8 lb

## 2011-03-03 DIAGNOSIS — I803 Phlebitis and thrombophlebitis of lower extremities, unspecified: Secondary | ICD-10-CM

## 2011-03-03 DIAGNOSIS — R600 Localized edema: Secondary | ICD-10-CM

## 2011-03-03 DIAGNOSIS — I8290 Acute embolism and thrombosis of unspecified vein: Secondary | ICD-10-CM

## 2011-03-03 DIAGNOSIS — I83893 Varicose veins of bilateral lower extremities with other complications: Secondary | ICD-10-CM

## 2011-03-03 DIAGNOSIS — R609 Edema, unspecified: Secondary | ICD-10-CM

## 2011-03-03 NOTE — Progress Notes (Signed)
Subjective:     Patient ID: William Martinez, male   DOB: 1955-11-11, 55 y.o.   MRN: [id number]  HPI 55 year old male patient is to me having previously evaluated him in November of 2009. 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 He has had a vein stripping on the left leg many years ago. He has never had a pulmonary embolus or DVT in the left leg 2 weeks ago he developed pain in the left inguinal area and was concerned about a "blood clot". He has had no change in the chronic swelling he has in the left leg. He has had no change in the chronic swelling of the right leg. His Coumadin is managed by Dr. Susann Givens and his INR is in the 2.5 range. He is due to be checked.  Past Medical History  Diagnosis Date  . Bronchitis   . Acquired deafness     childhood infection  . Thrombophlebitis   . Varicose veins   . DVT (deep venous thrombosis)   . COPD (chronic obstructive pulmonary disease)     History  Substance Use Topics  . Smoking status: Former Smoker    Types: Cigarettes    Quit date: 04/27/1988  . Smokeless tobacco: Not on file  . Alcohol Use: Yes     occasionally 1-2 can beer daily    Family History  Problem Relation Age of Onset  . Cancer Father   . Coronary artery disease Father   . Deep vein thrombosis Mother   . Stroke Mother   . Diabetes Sister   . Diabetes Brother     Allergies  Allergen Reactions  . Codeine     REACTION: alter moods  . Darvocet (Propoxyphene N-Acetaminophen)   . Percocet (Oxycodone-Acetaminophen)     Current outpatient prescriptions:ALPRAZolam (XANAX) 0.5 MG tablet, take 1 tablet by mouth at bedtime if needed, Disp: 30 tablet, Rfl: 2;  warfarin (COUMADIN) 10 MG tablet, Take 10 mg by mouth. Taking 10 mg. Tablet on Monday, Wednesday, and Friday. , Disp: , Rfl: ;  warfarin (COUMADIN) 7.5 MG tablet, Take 1 by mouth on tues, thurs sat and sun , Disp: , Rfl:  enoxaparin (LOVENOX) 100 MG/ML SOLN, Inject 100 mg into the  skin every 12 (twelve) hours.  , Disp: , Rfl: ;  Fluticasone-Salmeterol (ADVAIR DISKUS) 250-50 MCG/DOSE AEPB, Inhale 1 puff into the lungs every 12 (twelve) hours.  , Disp: , Rfl: ;  mometasone (NASONEX) 50 MCG/ACT nasal spray, Place 2 sprays into the nose daily.  , Disp: , Rfl:  PROAIR HFA 108 (90 BASE) MCG/ACT inhaler, INHALE 1 TO 2 PUFFS BY MOUTH EVERY 4 TO 6 HOURS AS NEEDED FOR SHORTNESS OF BREATH, Disp: 8.5 g, Rfl: 1  BP 163/112  Pulse 62  Resp 16  Ht 6\' 3"  (1.905 m)  Wt 272 lb 12.8 oz (123.741 kg)  BMI 34.10 kg/m2  Body mass index is 34.10 kg/(m^2).         Review of Systems he denies any chest pain, dyspnea on exertion, PND, orthopnea, lower extremity claudication or other new symptoms. Review of systems is totally negative     Objective:   Physical Exam pressure 163/112 heart rate 62 respirations 16 Gen. he is a well-developed well-nourished male in no apparent distress alert and oriented x3 Lungs no rhonchi or wheezing Cardiovascular regular rhythm no murmurs carotid pulses 3+ no bruits Abdomen is obese no palpable masses Lower extremity reveals 3+ femoral posterior  tibial pulses palpable bilaterally. There are varicosities in the left calf. He has 1+ edema bilaterally. There is localized tenderness this in the left suprapubic area adjacent to the inguinal crease. Neurologic exam normal  Today I ordered a venous duplex exam of the left leg which I reviewed and interpreted. There is no DVT. He has some superficial thrombophlebitis involving varicosities in the left inguinal area. The left great saphenous vein is absent. There is reflux in the left deep system.    Assessment:    superficial thrombophlebitis left inguinal area and superficial varicosities-no DVT    Plan:     #1 warm moist heat to left inguinal area #2 continue Coumadin therapy #3 return to see me on when necessary basis

## 2011-03-10 NOTE — Procedures (Unsigned)
DUPLEX DEEP VENOUS EXAM - LOWER EXTREMITY  INDICATION:  Venous thrombosis.  HISTORY:  Edema:  Yes. Trauma/Surgery:  No. Pain:  No. PE:  No. Previous DVT:  History of right leg deep and superficial venous thromboses. Anticoagulants: Other:  Complaint of palpable area in the left groin area.  DUPLEX EXAM:               CFV   SFV   PopV  PTV    GSV               R  L  R  L  R  L  R   L  R  L Thrombosis    o  o     o     o      o Spontaneous   +  +     +     +      + Phasic        +  +     +     +      + Augmentation  +  +     +     +      + Compressible  +  +     +     +      + Competent     o  o     o     o  Legend:  + - yes  o - no  p - partial  D - decreased  IMPRESSION:  Totally occlusive thrombus noted in the varicose veins of the left medial groin area.  No evidence of deep venous thrombosis noted in the left lower extremity.  The left great saphenous vein was not adequately visualized.  Reflux of >500 milliseconds noted in the left saphenofemoral junction and throughout the left femoral-popliteal venous system.   _____________________________ Quita Skye Hart [place], M.D.  CH/MEDQ  D:  03/04/2011  T:  03/04/2011  Job:  [id number]

## 2011-04-22 ENCOUNTER — Other Ambulatory Visit: Payer: BC Managed Care – PPO

## 2011-04-22 DIAGNOSIS — Z7901 Long term (current) use of anticoagulants: Secondary | ICD-10-CM

## 2011-04-23 ENCOUNTER — Telehealth: Payer: Self-pay

## 2011-04-23 NOTE — Telephone Encounter (Signed)
Called to give roberts results had to lev

## 2011-04-23 NOTE — Telephone Encounter (Signed)
Left message to back off greens recheck in 2 weeks

## 2011-04-29 ENCOUNTER — Other Ambulatory Visit: Payer: Self-pay | Admitting: Family Medicine

## 2011-04-30 ENCOUNTER — Other Ambulatory Visit: Payer: Self-pay

## 2011-04-30 MED ORDER — ALPRAZOLAM 0.5 MG PO TABS: 0.5000 mg | ORAL_TABLET | Freq: Every evening | ORAL | Status: DC | PRN

## 2011-04-30 NOTE — Telephone Encounter (Signed)
Is this ok?

## 2011-04-30 NOTE — Telephone Encounter (Signed)
Called med in 

## 2011-04-30 NOTE — Telephone Encounter (Signed)
Renew this 

## 2011-05-01 ENCOUNTER — Other Ambulatory Visit: Payer: Self-pay | Admitting: Family Medicine

## 2011-05-18 ENCOUNTER — Other Ambulatory Visit: Payer: BC Managed Care – PPO

## 2011-05-18 ENCOUNTER — Other Ambulatory Visit: Payer: Self-pay | Admitting: Family Medicine

## 2011-05-18 ENCOUNTER — Telehealth: Payer: Self-pay

## 2011-05-18 DIAGNOSIS — Z7901 Long term (current) use of anticoagulants: Secondary | ICD-10-CM

## 2011-05-18 LAB — PROTIME-INR
INR: 1.75 — ABNORMAL HIGH (ref <1.50)
Prothrombin Time: 21.1 seconds — ABNORMAL HIGH (ref 11.6–15.2)

## 2011-05-18 NOTE — Telephone Encounter (Signed)
William Martinez wanted to know since he has lost 50 pounds and is running 5 miles a day could the med be dropped to 7.5 please advise

## 2011-05-18 NOTE — Telephone Encounter (Signed)
No. His PT/INR Is not therapeutic and he might need more Coumadin rather than less.

## 2011-06-08 ENCOUNTER — Other Ambulatory Visit: Payer: Self-pay | Admitting: Family Medicine

## 2011-06-08 NOTE — Telephone Encounter (Signed)
Is this okay?

## 2011-06-17 ENCOUNTER — Encounter: Payer: Self-pay | Admitting: Family Medicine

## 2011-06-17 ENCOUNTER — Ambulatory Visit (INDEPENDENT_AMBULATORY_CARE_PROVIDER_SITE_OTHER): Payer: BC Managed Care – PPO | Admitting: Family Medicine

## 2011-06-17 VITALS — BP 140/90 | HR 58 | Temp 98.2°F | Wt 260.0 lb

## 2011-06-17 DIAGNOSIS — J019 Acute sinusitis, unspecified: Secondary | ICD-10-CM

## 2011-06-17 MED ORDER — AMOXICILLIN 875 MG PO TABS: 875.0000 mg | ORAL_TABLET | Freq: Two times a day (BID) | ORAL | Status: AC

## 2011-06-17 NOTE — Progress Notes (Signed)
  Subjective:    Patient ID: William Martinez, male    DOB: 09/08/1955, 56 y.o.   MRN: [id number]  HPI He has a two-week history of nasal and sinus congestion,PND. He started having difficulty with a productive cough yesterday and also saw some blood tinged sputum. No sore throat. He does not smoke.   Review of Systems     Objective:   Physical Exam alert and in no distress. Tympanic membranes and canals are normal. Throat is clear. Tonsils are normal. Neck is supple without adenopathy or thyromegaly. Cardiac exam shows a regular sinus rhythm without murmurs or gallops. Lungs are clear to auscultation. Nasal mucosa slightly red with some tenderness over frontal and ethmoid sinuses        Assessment & Plan:   1. Sinusitis acute    Amoxil. Call if not better at the end of the course

## 2011-06-17 NOTE — Patient Instructions (Signed)
Take all the antibiotic and if you're not totally back to normal at the end of it give me a call

## 2011-07-17 ENCOUNTER — Other Ambulatory Visit: Payer: Self-pay | Admitting: Family Medicine

## 2011-07-28 ENCOUNTER — Other Ambulatory Visit: Payer: Self-pay | Admitting: Family Medicine

## 2011-07-29 NOTE — Telephone Encounter (Signed)
Okay to renew this 

## 2011-07-29 NOTE — Telephone Encounter (Signed)
Is this ok?

## 2011-07-29 NOTE — Telephone Encounter (Signed)
Called med in per jcl 

## 2011-08-06 ENCOUNTER — Telehealth: Payer: Self-pay | Admitting: Family Medicine

## 2011-08-06 NOTE — Telephone Encounter (Signed)
LM

## 2012-04-05 ENCOUNTER — Other Ambulatory Visit (HOSPITAL_COMMUNITY): Payer: Self-pay | Admitting: Internal Medicine

## 2012-04-05 DIAGNOSIS — J449 Chronic obstructive pulmonary disease, unspecified: Secondary | ICD-10-CM

## 2012-04-06 ENCOUNTER — Ambulatory Visit (HOSPITAL_COMMUNITY)
Admission: RE | Admit: 2012-04-06 | Discharge: 2012-04-06 | Disposition: A | Discharge status: Home / Self Care | Payer: BC Managed Care – PPO | Source: Ambulatory Visit | Attending: Internal Medicine | Admitting: Internal Medicine

## 2012-04-06 DIAGNOSIS — J4489 Other specified chronic obstructive pulmonary disease: Secondary | ICD-10-CM | POA: Insufficient documentation

## 2012-04-06 DIAGNOSIS — J449 Chronic obstructive pulmonary disease, unspecified: Secondary | ICD-10-CM | POA: Insufficient documentation

## 2012-04-06 MED ORDER — ALBUTEROL SULFATE (5 MG/ML) 0.5% IN NEBU
2.5000 mg | INHALATION_SOLUTION | Freq: Once | RESPIRATORY_TRACT | Status: AC
  Administered 2012-04-06: 2.5 mg via RESPIRATORY_TRACT

## 2012-04-07 ENCOUNTER — Encounter (HOSPITAL_COMMUNITY): Payer: BC Managed Care – PPO

## 2012-06-15 ENCOUNTER — Other Ambulatory Visit: Payer: Self-pay | Admitting: Orthopedic Surgery

## 2012-06-15 ENCOUNTER — Other Ambulatory Visit (HOSPITAL_COMMUNITY): Payer: Self-pay | Admitting: Orthopedic Surgery

## 2012-06-15 ENCOUNTER — Ambulatory Visit
Admission: RE | Admit: 2012-06-15 | Discharge: 2012-06-15 | Disposition: A | Discharge status: Home / Self Care | Payer: BC Managed Care – PPO | Source: Ambulatory Visit | Attending: Orthopedic Surgery | Admitting: Orthopedic Surgery

## 2012-06-15 DIAGNOSIS — M79605 Pain in left leg: Secondary | ICD-10-CM

## 2012-06-15 DIAGNOSIS — M25572 Pain in left ankle and joints of left foot: Secondary | ICD-10-CM

## 2012-06-15 DIAGNOSIS — T148XXA Other injury of unspecified body region, initial encounter: Secondary | ICD-10-CM

## 2012-06-16 ENCOUNTER — Ambulatory Visit (HOSPITAL_COMMUNITY)
Admission: RE | Admit: 2012-06-16 | Discharge: 2012-06-16 | Disposition: A | Discharge status: Home / Self Care | Payer: BC Managed Care – PPO | Source: Ambulatory Visit | Attending: Orthopedic Surgery | Admitting: Orthopedic Surgery

## 2012-06-16 DIAGNOSIS — IMO0001 Reserved for inherently not codable concepts without codable children: Secondary | ICD-10-CM | POA: Insufficient documentation

## 2012-06-16 DIAGNOSIS — M25579 Pain in unspecified ankle and joints of unspecified foot: Secondary | ICD-10-CM | POA: Insufficient documentation

## 2012-06-16 DIAGNOSIS — R609 Edema, unspecified: Secondary | ICD-10-CM | POA: Insufficient documentation

## 2012-06-16 DIAGNOSIS — M7989 Other specified soft tissue disorders: Secondary | ICD-10-CM | POA: Insufficient documentation

## 2012-06-16 DIAGNOSIS — M659 Synovitis and tenosynovitis, unspecified: Secondary | ICD-10-CM | POA: Insufficient documentation

## 2012-06-16 DIAGNOSIS — M65979 Unspecified synovitis and tenosynovitis, unspecified ankle and foot: Secondary | ICD-10-CM | POA: Insufficient documentation

## 2012-06-16 DIAGNOSIS — X58XXXA Exposure to other specified factors, initial encounter: Secondary | ICD-10-CM | POA: Insufficient documentation

## 2012-06-16 DIAGNOSIS — S93499A Sprain of other ligament of unspecified ankle, initial encounter: Secondary | ICD-10-CM | POA: Insufficient documentation

## 2012-06-16 DIAGNOSIS — T148XXA Other injury of unspecified body region, initial encounter: Secondary | ICD-10-CM

## 2012-08-25 HISTORY — PX: FOOT SURGERY: SHX648

## 2013-01-13 ENCOUNTER — Encounter (HOSPITAL_COMMUNITY): Payer: Self-pay

## 2013-01-13 ENCOUNTER — Emergency Department (HOSPITAL_COMMUNITY): Payer: Worker's Compensation

## 2013-01-13 ENCOUNTER — Emergency Department (HOSPITAL_COMMUNITY)
Admission: EM | Admit: 2013-01-13 | Discharge: 2013-01-13 | Disposition: A | Discharge status: Home / Self Care | Payer: Worker's Compensation | Attending: Emergency Medicine | Admitting: Emergency Medicine

## 2013-01-13 DIAGNOSIS — J4489 Other specified chronic obstructive pulmonary disease: Secondary | ICD-10-CM | POA: Insufficient documentation

## 2013-01-13 DIAGNOSIS — S99919A Unspecified injury of unspecified ankle, initial encounter: Secondary | ICD-10-CM | POA: Insufficient documentation

## 2013-01-13 DIAGNOSIS — S20212A Contusion of left front wall of thorax, initial encounter: Secondary | ICD-10-CM

## 2013-01-13 DIAGNOSIS — W010XXA Fall on same level from slipping, tripping and stumbling without subsequent striking against object, initial encounter: Secondary | ICD-10-CM | POA: Insufficient documentation

## 2013-01-13 DIAGNOSIS — S8990XA Unspecified injury of unspecified lower leg, initial encounter: Secondary | ICD-10-CM | POA: Insufficient documentation

## 2013-01-13 DIAGNOSIS — S6990XA Unspecified injury of unspecified wrist, hand and finger(s), initial encounter: Secondary | ICD-10-CM | POA: Insufficient documentation

## 2013-01-13 DIAGNOSIS — M25561 Pain in right knee: Secondary | ICD-10-CM

## 2013-01-13 DIAGNOSIS — Z8709 Personal history of other diseases of the respiratory system: Secondary | ICD-10-CM | POA: Insufficient documentation

## 2013-01-13 DIAGNOSIS — Z86718 Personal history of other venous thrombosis and embolism: Secondary | ICD-10-CM | POA: Insufficient documentation

## 2013-01-13 DIAGNOSIS — Y9301 Activity, walking, marching and hiking: Secondary | ICD-10-CM | POA: Insufficient documentation

## 2013-01-13 DIAGNOSIS — IMO0002 Reserved for concepts with insufficient information to code with codable children: Secondary | ICD-10-CM | POA: Insufficient documentation

## 2013-01-13 DIAGNOSIS — Z79899 Other long term (current) drug therapy: Secondary | ICD-10-CM | POA: Insufficient documentation

## 2013-01-13 DIAGNOSIS — Z87891 Personal history of nicotine dependence: Secondary | ICD-10-CM | POA: Insufficient documentation

## 2013-01-13 DIAGNOSIS — Y9289 Other specified places as the place of occurrence of the external cause: Secondary | ICD-10-CM | POA: Insufficient documentation

## 2013-01-13 DIAGNOSIS — S59909A Unspecified injury of unspecified elbow, initial encounter: Secondary | ICD-10-CM | POA: Insufficient documentation

## 2013-01-13 DIAGNOSIS — S20219A Contusion of unspecified front wall of thorax, initial encounter: Secondary | ICD-10-CM | POA: Insufficient documentation

## 2013-01-13 DIAGNOSIS — M25531 Pain in right wrist: Secondary | ICD-10-CM

## 2013-01-13 DIAGNOSIS — H9190 Unspecified hearing loss, unspecified ear: Secondary | ICD-10-CM | POA: Insufficient documentation

## 2013-01-13 DIAGNOSIS — J449 Chronic obstructive pulmonary disease, unspecified: Secondary | ICD-10-CM | POA: Insufficient documentation

## 2013-01-13 DIAGNOSIS — Z8679 Personal history of other diseases of the circulatory system: Secondary | ICD-10-CM | POA: Insufficient documentation

## 2013-01-13 DIAGNOSIS — S8000XA Contusion of unspecified knee, initial encounter: Secondary | ICD-10-CM | POA: Insufficient documentation

## 2013-01-13 DIAGNOSIS — Z7901 Long term (current) use of anticoagulants: Secondary | ICD-10-CM | POA: Insufficient documentation

## 2013-01-13 MED ORDER — NAPROXEN 500 MG PO TABS: 500.0000 mg | ORAL_TABLET | Freq: Two times a day (BID) | ORAL | Status: AC

## 2013-01-13 NOTE — ED Notes (Signed)
bilat knee sleeves placed.

## 2013-01-13 NOTE — ED Notes (Signed)
Pt reports he tripped over a speed bump landing on both knee, both wrist, and Left side. Pt c/o bilateral wrist, knee, and Left side rib pain

## 2013-01-13 NOTE — ED Notes (Signed)
Pt c/o bilateral wrist and knee pain from a fall over a speed bump at work. Pt is able to move wrist and knee joints fully and against resistance. Abrasions on bilateral knees and bruising on wrist. Pt complains of left lower rib pain also from fall. Clear breath sounds were heard upon auscultation. Left lower ribs are tender to touch.

## 2013-01-13 NOTE — ED Provider Notes (Signed)
CSN: [id number]     Arrival date & time 01/13/13  1647 History  This chart was scribed for Trixie Dredge, PA working with Raelyn Number, DO by Quintella Reichert, ED Scribe. This patient was seen in room TR11C/TR11C and the patient's care was started at 5:27 PM.   Chief Complaint  Patient presents with  . Fall    The history is provided by the patient. No language interpreter was used.    HPI Comments: William Martinez is a 57 y.o. male who presents to the Emergency Department complaining of a fall that occurred earlier today with subsequent injuries to multiple areas.  Pt states that he was walking in a parking lot when he tripped over a speed bump and landed on his outstretched palms, knees, and left chest.  He denies head impact or LOC.  Presently he complains of constant 5/10 pain to left ribs, bilateral wrists, and bilateral knees.  Rib pain is exacerbated by breathing.  He did not take any pain medications pta.  He denies numbness or tingling in hands or feet, cough, abdominal pain, vomiting, hematuria, or fever.  Denies diplopia, headache, other focal neurological deficits.  Wife states he is acting like his normal self. Pt is on coumadin for h/o DVT.  He states he does not have a DVT currently.  Pt states his last tetanus vaccination was approximately 10 years ago.   Past Medical History  Diagnosis Date  . Bronchitis   . Acquired deafness     childhood infection  . Thrombophlebitis   . Varicose veins   . DVT (deep venous thrombosis)   . COPD (chronic obstructive pulmonary disease)     Past Surgical History  Procedure Laterality Date  . Shoulder susrgery    . Vein stripping    . Elbow surgery    . Phlebectomy    . Foot surgery  May 2014    Left    Family History  Problem Relation Age of Onset  . Cancer Father   . Coronary artery disease Father   . Deep vein thrombosis Mother   . Stroke Mother   . Diabetes Sister   . Diabetes Brother     History  Substance Use Topics  .  Smoking status: Former Smoker    Types: Cigarettes    Quit date: 04/27/1988  . Smokeless tobacco: Not on file  . Alcohol Use: Yes     Comment: occasionally 1-2 can beer daily     Review of Systems  Constitutional: Negative for fever.  Respiratory: Negative for cough.   Cardiovascular: Positive for chest pain (left ribs).  Gastrointestinal: Negative for vomiting and abdominal pain.  Genitourinary: Negative for hematuria.  Musculoskeletal: Positive for arthralgias.  Skin: Positive for wound (abrasion to right knee).  Neurological: Negative for numbness.     Allergies  Codeine; Darvocet; and Percocet  Home Medications   Current Outpatient Rx  Name  Route  Sig  Dispense  Refill  . ADVAIR DISKUS 250-50 MCG/DOSE AEPB      inhale 1 dose by mouth twice a day (RINSE OUT MOUTH AFTER USE)   60 each   PRN   . ALPRAZolam (XANAX) 0.5 MG tablet      take 1 tablet by mouth at bedtime if needed   30 tablet   2   . COUMADIN 10 MG tablet      take 1 tablet by mouth once daily   30 tablet   1   . COUMADIN 7.5  MG tablet      take 1 tablet by mouth once daily   30 tablet   5   . mometasone (NASONEX) 50 MCG/ACT nasal spray   Nasal   Place 2 sprays into the nose daily.           Marland Kitchen PROAIR HFA 108 (90 BASE) MCG/ACT inhaler      INHALE 1 TO 2 PUFFS BY MOUTH EVERY 4 TO 6 HOURS AS NEEDED FOR SHORTNESS OF BREATH   8.5 g   1    BP 151/85  Pulse 61  Temp(Src) 97.7 F (36.5 C) (Oral)  Resp 16  SpO2 98%  Physical Exam  Nursing note and vitals reviewed. Constitutional: He appears well-developed and well-nourished. No distress.  HENT:  Head: Normocephalic and atraumatic.  Neck: Neck supple.  Cardiovascular: Normal rate, regular rhythm, normal heart sounds and intact distal pulses.   No murmur heard. Pulmonary/Chest: Effort normal and breath sounds normal. No respiratory distress. He has no wheezes. He has no rales. He exhibits tenderness.  Left anterior ribs tender. No  skin changes. Lungs clear bilaterally.  Abdominal: Soft. He exhibits no mass. There is no tenderness. There is no rebound and no guarding.  Musculoskeletal:       Right wrist: He exhibits tenderness. He exhibits normal range of motion.       Left wrist: He exhibits tenderness. He exhibits normal range of motion.       Right knee: He exhibits normal range of motion. Tenderness found.       Left knee: He exhibits normal range of motion. Tenderness found.       Right hand: He exhibits normal range of motion, no tenderness and normal capillary refill.       Left hand: He exhibits no tenderness and normal capillary refill. Normal sensation noted.  Abrasion to left knee, no active bleeding.  Some ecchymosis.  Tender over patella.  No laxity of joint.  No pain with anterior valgus or varus stress. Ecchymosis and tenderness over superior right patella, no joint laxity.  No pain with anterior valgus or varus stress. Full flexion and extension of both knees. Ventral right wrist tender. Full active ROM of wrist. Right hand has full active ROM of all digits.  CR<2 seconds in all digits.  Hand is non-tender. Right elbow and right forearm non-tender.   Radial aspect of left wrist is tender.  Full active ROM of wrist.  Left hand non-tender.  Sensation intact.  CR<2 seconds in all digits.  Neurological: He is alert.  Skin: Abrasion noted. He is not diaphoretic.    ED Course  Procedures (including critical care time)  DIAGNOSTIC STUDIES: Oxygen Saturation is 98% on room air, normal by my interpretation.    COORDINATION OF CARE: 5:35 PM-Discussed treatment plan which includes imaging with pt at bedside and pt agreed to plan.    Labs Review Labs Reviewed - No data to display  Imaging Review Dg Ribs Unilateral W/chest Left  01/13/2013   CLINICAL DATA:  Fall, left lower rib pain  EXAM: LEFT RIBS AND CHEST - 3+ VIEW  COMPARISON:  Chest radiographs dated 05/06/2010  FINDINGS: Lungs are clear. No pleural  effusion or pneumothorax.  The heart is normal in size.  No displaced left rib fracture is seen.  IMPRESSION: No displaced left rib fracture is seen.   Electronically Signed   By: Charline Bills M.D.   On: 01/13/2013 19:09   Dg Wrist Complete Left  01/13/2013  CLINICAL DATA:  Recent fall.  EXAM: LEFT WRIST - COMPLETE 3+ VIEW  COMPARISON:  None.  FINDINGS: There is no evidence of fracture or dislocation. There is no evidence of arthropathy or other focal bone abnormality. Soft tissues are unremarkable.  IMPRESSION: Negative.   Electronically Signed   By: Signa Kell M.D.   On: 01/13/2013 19:05   Dg Wrist Complete Right  01/13/2013   CLINICAL DATA:  Fall  EXAM: RIGHT WRIST - COMPLETE 3+ VIEW  COMPARISON:  None.  FINDINGS: There is no evidence of fracture or dislocation. There is no evidence of arthropathy or other focal bone abnormality. Soft tissues are unremarkable.  IMPRESSION: Negative.   Electronically Signed   By: Signa Kell M.D.   On: 01/13/2013 19:11   Dg Knee Complete 4 Views Left  01/13/2013   CLINICAL DATA:  Fall, bilateral knee pain  EXAM: LEFT KNEE - COMPLETE 4+ VIEW  COMPARISON:  None.  FINDINGS: No fracture or dislocation is seen.  Mild tricompartmental degenerative changes.  The visualized soft tissues are unremarkable.  No suprapatellar knee joint effusion.  IMPRESSION: No fracture or dislocation is seen.  Mild degenerative changes.   Electronically Signed   By: Charline Bills M.D.   On: 01/13/2013 19:07   Dg Knee Complete 4 Views Right  01/13/2013   CLINICAL DATA:  Fall, bilateral knee pain  EXAM: RIGHT KNEE - COMPLETE 4+ VIEW  COMPARISON:  None.  FINDINGS: Cancer fracture  Very mild degenerative changes.  The visualized soft tissues are unremarkable.  Possible small suprapatellar knee joint effusion.  IMPRESSION: No fracture or dislocation is seen.  Very mild degenerative changes with possible small suprapatellar joint effusion.   Electronically Signed   By: Charline Bills M.D.   On: 01/13/2013 19:08   INR was check 3 days ago and was 1.8.   Pt declines tetanus vx.   MDM   1. Chest wall contusion, left, initial encounter   2. Bilateral wrist pain   3. Bilateral knee pain   4. Contusion, knee, unspecified laterality, initial encounter    Patient with trip and fall earlier today.  He is on coumadin but fell from standing, did not hit head.  After several hours he has no neurological complaints and no apparent deficits.  His INR checked 3 days ago was 1.8.  No fractures on xray.  Pt d/c home with knee wraps, naproxen (patient preference), PCP follow in 3-4 days.  Discussed strict return precautions with patient and wife.  Discussed result, findings, treatment, and follow up  with patient.  Pt given return precautions.  Pt verbalizes understanding and agrees with plan.      I doubt any other EMC precluding discharge at this time including, but not necessarily limited to the following: intracranial injury, significant traumatic internal bleeding   I personally performed the services described in this documentation, which was scribed in my presence. The recorded information has been reviewed and is accurate.    Trixie Dredge, PA-C 01/13/13 2028

## 2013-01-13 NOTE — ED Provider Notes (Signed)
Medical screening examination/treatment/procedure(s) were performed by non-physician practitioner and as supervising physician I was immediately available for consultation/collaboration.  Kristen N Ward, DO 01/13/13 2337 

## 2013-04-07 ENCOUNTER — Emergency Department (INDEPENDENT_AMBULATORY_CARE_PROVIDER_SITE_OTHER): Payer: BC Managed Care – PPO

## 2013-04-07 ENCOUNTER — Encounter (HOSPITAL_COMMUNITY): Payer: Self-pay | Admitting: Emergency Medicine

## 2013-04-07 ENCOUNTER — Emergency Department (INDEPENDENT_AMBULATORY_CARE_PROVIDER_SITE_OTHER)
Admission: EM | Admit: 2013-04-07 | Discharge: 2013-04-07 | Disposition: A | Discharge status: Home / Self Care | Payer: BC Managed Care – PPO | Source: Home / Self Care | Attending: Family Medicine | Admitting: Family Medicine

## 2013-04-07 DIAGNOSIS — J40 Bronchitis, not specified as acute or chronic: Secondary | ICD-10-CM

## 2013-04-07 MED ORDER — ALBUTEROL SULFATE (5 MG/ML) 0.5% IN NEBU
2.5000 mg | INHALATION_SOLUTION | Freq: Once | RESPIRATORY_TRACT | Status: AC
  Administered 2013-04-07: 2.5 mg via RESPIRATORY_TRACT

## 2013-04-07 MED ORDER — ALBUTEROL SULFATE HFA 108 (90 BASE) MCG/ACT IN AERS: 1.0000 | INHALATION_SPRAY | Freq: Four times a day (QID) | RESPIRATORY_TRACT | Status: AC | PRN

## 2013-04-07 MED ORDER — IPRATROPIUM BROMIDE 0.02 % IN SOLN
0.5000 mg | Freq: Once | RESPIRATORY_TRACT | Status: AC
  Administered 2013-04-07: 0.5 mg via RESPIRATORY_TRACT

## 2013-04-07 MED ORDER — AZITHROMYCIN 250 MG PO TABS: 250.0000 mg | ORAL_TABLET | Freq: Every day | ORAL | Status: AC

## 2013-04-07 NOTE — ED Notes (Signed)
C/o productive cough with sob. Hx of bronchitis.  Fever. Headache. States "feels like sinus infection".  No relief with otc meds.  Denies n/v/d.  On set 12/11

## 2013-04-07 NOTE — ED Provider Notes (Signed)
CSN: [id number]     Arrival date & time 04/07/13  7829 History   First MD Initiated Contact with Patient 04/07/13 [phone]     Chief Complaint  Patient presents with  . URI   (Consider location/radiation/quality/duration/timing/severity/associated sxs/prior Treatment) Patient is a 57 y.o. male presenting with URI. The history is provided by the patient. No language interpreter was used.  URI Presenting symptoms: congestion and cough   Severity:  Moderate Onset quality:  Gradual Timing:  Constant Progression:  Worsening Chronicity:  New Relieved by:  Nothing Worsened by:  Nothing tried Ineffective treatments:  None tried Associated symptoms: neck pain   Risk factors: chronic respiratory disease   Pt complains of a cough and congestion..  Pt has a history of bronchitis.   Pt also has sinus congestion.    Past Medical History  Diagnosis Date  . Bronchitis   . Acquired deafness     childhood infection  . Thrombophlebitis   . Varicose veins   . DVT (deep venous thrombosis)   . COPD (chronic obstructive pulmonary disease)    Past Surgical History  Procedure Laterality Date  . Shoulder susrgery    . Vein stripping    . Elbow surgery    . Phlebectomy    . Foot surgery  May 2014    Left   Family History  Problem Relation Age of Onset  . Cancer Father   . Coronary artery disease Father   . Deep vein thrombosis Mother   . Stroke Mother   . Diabetes Sister   . Diabetes Brother    History  Substance Use Topics  . Smoking status: Former Smoker    Types: Cigarettes    Quit date: 04/27/1988  . Smokeless tobacco: Not on file  . Alcohol Use: Yes     Comment: occasionally 1-2 can beer daily    Review of Systems  HENT: Positive for congestion.   Respiratory: Positive for cough.   Musculoskeletal: Positive for neck pain.  All other systems reviewed and are negative.    Allergies  Codeine; Darvocet; and Percocet  Home Medications   Current Outpatient Rx  Name  Route   Sig  Dispense  Refill  . allopurinol (ZYLOPRIM) 100 MG tablet   Oral   Take 100 mg by mouth daily.         . benzonatate (TESSALON) 200 MG capsule   Oral   Take 200 mg by mouth 3 (three) times daily as needed for cough.         . colchicine 0.6 MG tablet   Oral   Take 0.6 mg by mouth daily.         . predniSONE (DELTASONE) 20 MG tablet   Oral   Take 20 mg by mouth daily with breakfast.         . warfarin (COUMADIN) 6 MG tablet   Oral   Take 6-9 mg by mouth daily. Tuesday-Sunday 9mg , Monday 6mg          . zolpidem (AMBIEN) 10 MG tablet   Oral   Take 10 mg by mouth at bedtime as needed for sleep.         . clonazePAM (KLONOPIN) 1 MG tablet   Oral   Take 0.5 mg by mouth at bedtime as needed for anxiety.         . naproxen (NAPROSYN) 500 MG tablet   Oral   Take 1 tablet (500 mg total) by mouth 2 (two) times daily.  20 tablet   0    BP 135/71  Pulse 96  Temp(Src) 100.7 F (38.2 C) (Oral)  Resp 20  SpO2 94% Physical Exam  Nursing note and vitals reviewed. Constitutional: He appears well-developed and well-nourished.  HENT:  Head: Normocephalic and atraumatic.  Right Ear: External ear normal.  Left Ear: External ear normal.  Eyes: Conjunctivae are normal. Pupils are equal, round, and reactive to light.  Neck: Normal range of motion. Neck supple.  Cardiovascular: Normal rate and regular rhythm.   Pulmonary/Chest: Effort normal and breath sounds normal.  Abdominal: Soft. Bowel sounds are normal.  Musculoskeletal: Normal range of motion.  Neurological: He is alert.  Skin: Skin is warm.  Psychiatric: He has a normal mood and affect.    ED Course  Procedures (including critical care time) Labs Review Labs Reviewed - No data to display Imaging Review Dg Chest 2 View  04/07/2013   CLINICAL DATA:  Upper respiratory infection.  EXAM: CHEST  2 VIEW  COMPARISON:  January 13, 2013.  FINDINGS: The heart size and mediastinal contours are within normal  limits. Both lungs are clear. No pneumothorax or pleural effusion is noted. The visualized skeletal structures are unremarkable.  IMPRESSION: No acute cardiopulmonary abnormality seen.   Electronically Signed   By: Roque Lias M.D.   On: 04/07/2013 10:07    EKG Interpretation    Date/Time:    Ventricular Rate:    PR Interval:    QRS Duration:   QT Interval:    QTC Calculation:   R Axis:     Text Interpretation:              MDM   1. Bronchitis    Chest xray normal.    Pt given rx for albuterol inhaler and zithromax.    Follow up with your Physicain for recheck in 1 week   Elson Areas, PA-C 04/07/13 1044  Lonia Skinner [place], [place] 04/07/13 1047  Lonia Skinner [place], [place] 04/07/13 1047

## 2013-04-08 NOTE — ED Provider Notes (Signed)
Medical screening examination/treatment/procedure(s) were performed by a resident physician or non-physician practitioner and as the supervising physician I was immediately available for consultation/collaboration.  Clementeen Graham, MD    Rodolph Bong, MD 04/08/13 [phone]

## 2014-02-15 ENCOUNTER — Encounter (HOSPITAL_COMMUNITY): Payer: Self-pay | Admitting: Emergency Medicine

## 2014-02-15 ENCOUNTER — Emergency Department (HOSPITAL_COMMUNITY): Payer: Worker's Compensation

## 2014-02-15 ENCOUNTER — Emergency Department (HOSPITAL_COMMUNITY)
Admission: EM | Admit: 2014-02-15 | Discharge: 2014-02-16 | Disposition: A | Discharge status: Home / Self Care | Payer: Worker's Compensation | Attending: Emergency Medicine | Admitting: Emergency Medicine

## 2014-02-15 DIAGNOSIS — J449 Chronic obstructive pulmonary disease, unspecified: Secondary | ICD-10-CM | POA: Insufficient documentation

## 2014-02-15 DIAGNOSIS — Z791 Long term (current) use of non-steroidal anti-inflammatories (NSAID): Secondary | ICD-10-CM | POA: Insufficient documentation

## 2014-02-15 DIAGNOSIS — S8002XA Contusion of left knee, initial encounter: Secondary | ICD-10-CM | POA: Insufficient documentation

## 2014-02-15 DIAGNOSIS — S29012A Strain of muscle and tendon of back wall of thorax, initial encounter: Secondary | ICD-10-CM | POA: Insufficient documentation

## 2014-02-15 DIAGNOSIS — Z7901 Long term (current) use of anticoagulants: Secondary | ICD-10-CM | POA: Insufficient documentation

## 2014-02-15 DIAGNOSIS — Z792 Long term (current) use of antibiotics: Secondary | ICD-10-CM | POA: Diagnosis not present

## 2014-02-15 DIAGNOSIS — Z86718 Personal history of other venous thrombosis and embolism: Secondary | ICD-10-CM | POA: Insufficient documentation

## 2014-02-15 DIAGNOSIS — S39012A Strain of muscle, fascia and tendon of lower back, initial encounter: Secondary | ICD-10-CM | POA: Diagnosis not present

## 2014-02-15 DIAGNOSIS — H9190 Unspecified hearing loss, unspecified ear: Secondary | ICD-10-CM | POA: Diagnosis not present

## 2014-02-15 DIAGNOSIS — Z79899 Other long term (current) drug therapy: Secondary | ICD-10-CM | POA: Insufficient documentation

## 2014-02-15 DIAGNOSIS — Z7952 Long term (current) use of systemic steroids: Secondary | ICD-10-CM | POA: Diagnosis not present

## 2014-02-15 DIAGNOSIS — Y9289 Other specified places as the place of occurrence of the external cause: Secondary | ICD-10-CM | POA: Insufficient documentation

## 2014-02-15 DIAGNOSIS — Y9389 Activity, other specified: Secondary | ICD-10-CM | POA: Diagnosis not present

## 2014-02-15 DIAGNOSIS — Z8672 Personal history of thrombophlebitis: Secondary | ICD-10-CM | POA: Diagnosis not present

## 2014-02-15 DIAGNOSIS — Z87891 Personal history of nicotine dependence: Secondary | ICD-10-CM | POA: Diagnosis not present

## 2014-02-15 DIAGNOSIS — S29019A Strain of muscle and tendon of unspecified wall of thorax, initial encounter: Secondary | ICD-10-CM

## 2014-02-15 DIAGNOSIS — M545 Low back pain, unspecified: Secondary | ICD-10-CM

## 2014-02-15 DIAGNOSIS — W19XXXA Unspecified fall, initial encounter: Secondary | ICD-10-CM

## 2014-02-15 DIAGNOSIS — W010XXA Fall on same level from slipping, tripping and stumbling without subsequent striking against object, initial encounter: Secondary | ICD-10-CM | POA: Insufficient documentation

## 2014-02-15 DIAGNOSIS — Z8679 Personal history of other diseases of the circulatory system: Secondary | ICD-10-CM | POA: Insufficient documentation

## 2014-02-15 DIAGNOSIS — S8992XA Unspecified injury of left lower leg, initial encounter: Secondary | ICD-10-CM | POA: Diagnosis not present

## 2014-02-15 DIAGNOSIS — Y990 Civilian activity done for income or pay: Secondary | ICD-10-CM | POA: Insufficient documentation

## 2014-02-15 DIAGNOSIS — M549 Dorsalgia, unspecified: Secondary | ICD-10-CM

## 2014-02-15 DIAGNOSIS — M25562 Pain in left knee: Secondary | ICD-10-CM

## 2014-02-15 DIAGNOSIS — S3992XA Unspecified injury of lower back, initial encounter: Secondary | ICD-10-CM | POA: Diagnosis present

## 2014-02-15 DIAGNOSIS — M25572 Pain in left ankle and joints of left foot: Secondary | ICD-10-CM

## 2014-02-15 MED ORDER — METHOCARBAMOL 500 MG PO TABS
1000.0000 mg | ORAL_TABLET | Freq: Once | ORAL | Status: AC
  Administered 2014-02-15: 1000 mg via ORAL
  Filled 2014-02-15: qty 2

## 2014-02-15 MED ORDER — HYDROCODONE-ACETAMINOPHEN 5-325 MG PO TABS
2.0000 | ORAL_TABLET | Freq: Once | ORAL | Status: AC
  Administered 2014-02-15: 2 via ORAL
  Filled 2014-02-15: qty 2

## 2014-02-15 NOTE — ED Provider Notes (Signed)
CSN: [id number]     Arrival date & time 02/15/14  2036 History  This chart was scribed for non-physician practitioner Dierdre ForthHannah Blas Riches PA-C working with Audree CamelScott T Goldston, MD by Murriel HopperAlec Bankhead, ED Scribe. This patient was seen in room TR08C/TR08C and the patient's care was started at 10:38 PM.    No chief complaint on file.    (Consider location/radiation/quality/duration/timing/severity/associated sxs/prior Treatment) The history is provided by the patient and medical records. No language interpreter was used.    HPI Comments: William PeopleRobert Martinez is a 58 y.o. male who presents to the Emergency Department complaining of constant pain in his left ankle with associated swelling that occurred three hours ago at work when he fell. Pt notes that his foot slipped out from underneath him and he landed on his back. Pt also notes associated left knee pain and lower back pain as well. Pt notes that he can barely walk on his leg due to the pain but denies numbness or tingling. Pt has not taken anything for his symptoms. He denies numbness, tingling, weakness, or left hip pain.   He denies history of chronic back pain, loss of bowel or bladder control, saddle anesthesia.  Past Medical History  Diagnosis Date  . Bronchitis   . Acquired deafness     childhood infection  . Thrombophlebitis   . Varicose veins   . DVT (deep venous thrombosis)   . COPD (chronic obstructive pulmonary disease)    Past Surgical History  Procedure Laterality Date  . Shoulder susrgery    . Vein stripping    . Elbow surgery    . Phlebectomy    . Foot surgery  May 2014    Left   Family History  Problem Relation Age of Onset  . Cancer Father   . Coronary artery disease Father   . Deep vein thrombosis Mother   . Stroke Mother   . Diabetes Sister   . Diabetes Brother    History  Substance Use Topics  . Smoking status: Former Smoker    Types: Cigarettes    Quit date: 04/27/1988  . Smokeless tobacco: Not on file  .  Alcohol Use: Yes     Comment: occasionally 1-2 can beer daily    Review of Systems  Constitutional: Negative for fever, chills and fatigue.  HENT: Negative for dental problem, facial swelling and nosebleeds.   Eyes: Negative for visual disturbance.  Respiratory: Negative for cough, chest tightness, shortness of breath, wheezing and stridor.   Cardiovascular: Negative for chest pain.  Gastrointestinal: Negative for nausea, vomiting, abdominal pain and diarrhea.  Genitourinary: Negative for dysuria, urgency, frequency, hematuria and flank pain.  Musculoskeletal: Positive for arthralgias (left knee, left ankle), back pain and gait problem ( 2/2 pain). Negative for joint swelling, neck pain and neck stiffness.  Skin: Negative for rash and wound.  Neurological: Negative for syncope, weakness, light-headedness, numbness and headaches.  Hematological: Does not bruise/bleed easily.  Psychiatric/Behavioral: The patient is not nervous/anxious.   All other systems reviewed and are negative.     Allergies  Codeine; Darvocet; and Percocet  Home Medications   Prior to Admission medications   Medication Sig Start Date End Date Taking? Authorizing Provider  albuterol (PROVENTIL HFA;VENTOLIN HFA) 108 (90 BASE) MCG/ACT inhaler Inhale 1-2 puffs into the lungs every 6 (six) hours as needed for wheezing or shortness of breath. 04/07/13   Elson AreasLeslie K Sofia, PA-C  allopurinol (ZYLOPRIM) 100 MG tablet Take 100 mg by mouth daily.    Historical  Provider, MD  azithromycin (ZITHROMAX) 250 MG tablet Take 1 tablet (250 mg total) by mouth daily. Take first 2 tablets together, then 1 every day until finished. 04/07/13   Elson Areas, PA-C  benzonatate (TESSALON) 200 MG capsule Take 200 mg by mouth 3 (three) times daily as needed for cough.    Historical Provider, MD  clonazePAM (KLONOPIN) 1 MG tablet Take 0.5 mg by mouth at bedtime as needed for anxiety.    Historical Provider, MD  colchicine 0.6 MG tablet Take 0.6  mg by mouth daily.    Historical Provider, MD  HYDROcodone-acetaminophen (NORCO/VICODIN) 5-325 MG per tablet Take 1-2 tablets by mouth every 6 (six) hours as needed (Take 1 - 2 tablets every 4 - 6 hours.). 02/16/14   Malcome Ambrocio, PA-C  ibuprofen (ADVIL,MOTRIN) 800 MG tablet Take 1 tablet (800 mg total) by mouth every 8 (eight) hours as needed. 02/16/14   Teruko Joswick, PA-C  methocarbamol (ROBAXIN) 750 MG tablet Take 1 tablet (750 mg total) by mouth 4 (four) times daily as needed for muscle spasms (Take 1 tablet every 6 hours as needed for muscle spasms.). 02/16/14   Audrena Talaga, PA-C  naproxen (NAPROSYN) 500 MG tablet Take 1 tablet (500 mg total) by mouth 2 (two) times daily. 01/13/13   Trixie Dredge, PA-C  predniSONE (DELTASONE) 20 MG tablet Take 20 mg by mouth daily with breakfast.    Historical Provider, MD  warfarin (COUMADIN) 6 MG tablet Take 6-9 mg by mouth daily. Tuesday-Sunday 9mg , Monday 6mg     Historical Provider, MD  zolpidem (AMBIEN) 10 MG tablet Take 10 mg by mouth at bedtime as needed for sleep.    Historical Provider, MD   BP 141/63  Pulse 74  Temp(Src) 98.8 F (37.1 C) (Oral)  Resp 14  Ht 6\' 3"  (1.905 m)  Wt 276 lb (125.193 kg)  BMI 34.50 kg/m2  SpO2 96% Physical Exam  Nursing note and vitals reviewed. Constitutional: He appears well-developed and well-nourished. No distress.  HENT:  Head: Normocephalic and atraumatic.  Mouth/Throat: Oropharynx is clear and moist. No oropharyngeal exudate.  Pt is deaf  Eyes: Conjunctivae are normal.  Neck: Normal range of motion. Neck supple.  Full ROM without pain No midline or paraspinal tenderness  Cardiovascular: Normal rate, regular rhythm, normal heart sounds and intact distal pulses.   Capillary refill is less than 3 seconds  Pulmonary/Chest: Effort normal and breath sounds normal. No respiratory distress. He has no wheezes. He exhibits no tenderness.  Equal chest expansion Clear and equal breath sounds And  no chest tenderness, ecchymosis or flail segment or contusion  Abdominal: Soft. Bowel sounds are normal. He exhibits no distension. There is no tenderness.  No ecchymosis or contusion Soft and nontender  Musculoskeletal: He exhibits tenderness. He exhibits no edema.  Full range of motion of the T-spine and L-spine No tenderness to palpation of the spinous processes of the T-spine or L-spine Mild tenderness to palpation of the left paraspinous muscles of the thoracic and L-spine Full range of motion of left hip without pain Full range of motion of left knee with pain and ecchymosis noted to the medial joint line; mild knee effusion noted Decreased range of motion of left ankle secondary to pain and poor effort, tenderness to palpation along the medial and lateral joint line  Lymphadenopathy:    He has no cervical adenopathy.  Neurological: He is alert. He has normal reflexes. Coordination normal.  Reflex Scores:      Bicep reflexes  are 2+ on the right side and 2+ on the left side.      Brachioradialis reflexes are 2+ on the right side and 2+ on the left side.      Patellar reflexes are 2+ on the right side and 2+ on the left side.      Achilles reflexes are 2+ on the right side and 2+ on the left side. Speech is clear and goal oriented, follows commands Normal 5/5 strength in upper and lower extremities bilaterally including dorsiflexion and plantar flexion, strong and equal grip strength Sensation normal to light and sharp touch Moves extremities without ataxia, coordination intact Antalgic gait with cane and limp due to left ankle/left knee pain Normal balance No Clonus in the right lower extremity, difficult to assess due to pain and left lower extremity   Skin: Skin is warm and dry. No rash noted. He is not diaphoretic. No erythema.  No tenting of the skin  Psychiatric: He has a normal mood and affect. His behavior is normal.    ED Course  Procedures (including critical care  time)  DIAGNOSTIC STUDIES: Oxygen Saturation is 96% on room, normal by my interpretation.    COORDINATION OF CARE: 10:41 PM Discussed treatment plan with pt at bedside and pt agreed to plan.   Labs Review Labs Reviewed - No data to display  Imaging Review Dg Thoracic Spine 2 View  02/15/2014   CLINICAL DATA:  Initial evaluation for acute pain around T7. Status post fall.  EXAM: THORACIC SPINE - 2 VIEW  COMPARISON:  None.  FINDINGS: There is no evidence of thoracic spine fracture. Alignment is normal. No other significant bone abnormalities are identified.  IMPRESSION: Negative.   Electronically Signed   By: Rise MuBenjamin  McClintock M.D.   On: 02/15/2014 23:41   Dg Lumbar Spine Complete  02/15/2014   CLINICAL DATA:  Left ankle and left-sided back pain after a fall tonight.  EXAM: LUMBAR SPINE - COMPLETE 4+ VIEW  COMPARISON:  03/30/2009  FINDINGS: Normal alignment of the lumbar spine. Degenerative changes throughout with narrowed lumbar interspaces and associated endplate hypertrophic changes. Degenerative changes have progressed since previous study. Degenerative changes in the lower lumbar facet joints. No vertebral compression deformities. No focal bone lesion or bone destruction identified. Bone cortex and trabecular architecture appear intact. Normal alignment of the facet joints.  IMPRESSION: Degenerative changes in the lumbar spine demonstrating some progression since previous study. No acute bony abnormalities.   Electronically Signed   By: Burman NievesWilliam  Stevens M.D.   On: 02/15/2014 21:39   Dg Ankle Complete Left  02/15/2014   CLINICAL DATA:  58 year old male status post fall in hallway with acute pain. Initial encounter.  EXAM: LEFT ANKLE COMPLETE - 3+ VIEW  COMPARISON:  None.  FINDINGS: Mortise joint alignment preserved. Talar dome intact. No distal tibia or fibula fracture identified. On the lateral view there is loss of height of the talus, and some pes planus. However, no discrete talar or  calcaneal fracture identified. There are midfoot osteophytes associated.  IMPRESSION: 1. No acute fracture or dislocation identified about the left ankle. 2. Suspect chronic degenerative changes of the talus and calcaneus explaining some loss of talar height.   Electronically Signed   By: Augusto GambleLee  Hall M.D.   On: 02/15/2014 21:39   Dg Knee Complete 4 Views Left  02/15/2014   CLINICAL DATA:  Patient slipped and fell this morning with thoracic spine pain and anterior left knee pain subsequently. Twisting injury.  EXAM: LEFT  KNEE - COMPLETE 4+ VIEW  COMPARISON:  01/13/2013  FINDINGS: No evidence of acute fracture or subluxation. No focal bone lesion or bone destruction. Bone cortex and trabecular architecture appear intact. No radiopaque soft tissue foreign bodies.  IMPRESSION: No acute bony abnormalities.   Electronically Signed   By: Burman Nieves M.D.   On: 02/15/2014 23:41     EKG Interpretation None      MDM   Final diagnoses:  Fall  Mid back pain  Left knee pain  Bilateral low back pain without sciatica  Left ankle pain  Strain of thoracic region, initial encounter [S29.012A]  Strain of lumbar region, initial encounter [S39.012A]   William Martinez presents with low back pain, left ankle, left knee and mid back pain after slip and fall tonight.  No neurological deficits, antalgic gait.  Patient can walk but states is painful.  No loss of bowel or bladder control.  No concern for cauda equina.  No fever, night sweats, weight loss, h/o cancer, IVDU.  RICE protocol and pain medicine indicated and discussed with patient.   Patient X-Ray negative for obvious fracture or dislocation of the left ankle, left knee, T-spine and L-spine. Pain managed in ED. Pt advised to follow up with orthopedics if symptoms persist for possibility of missed fracture diagnosis. Patient reports he is already seeing orthopedics at [facility]. Patient given brace while in ED, conservative therapy  recommended and discussed.   I have personally reviewed patient's vitals, nursing note and any pertinent labs or imaging.  I performed an focused physical exam; undressed when appropriate .    It has been determined that no acute conditions requiring further emergency intervention are present at this time. The patient/guardian have been advised of the diagnosis and plan. I reviewed any labs and imaging including any potential incidental findings. We have discussed signs and symptoms that warrant return to the ED and they are listed in the discharge instructions.    Vital signs are stable at discharge.   BP 141/63  Pulse 74  Temp(Src) 98.8 F (37.1 C) (Oral)  Resp 14  Ht 6\' 3"  (1.905 m)  Wt 276 lb (125.193 kg)  BMI 34.50 kg/m2  SpO2 96%  I personally performed the services described in this documentation, which was scribed in my presence. The recorded information has been reviewed and is accurate.   Dahlia Client Hartleigh Edmonston, PA-C 02/16/14 [phone]

## 2014-02-15 NOTE — ED Notes (Signed)
Pt. slipped and fell this evening , no LOC / ambulatory , reports pain at left ankle and low back pain .

## 2014-02-16 MED ORDER — IBUPROFEN 800 MG PO TABS: 800.0000 mg | ORAL_TABLET | Freq: Three times a day (TID) | ORAL | Status: AC | PRN

## 2014-02-16 MED ORDER — METHOCARBAMOL 750 MG PO TABS: 750.0000 mg | ORAL_TABLET | Freq: Four times a day (QID) | ORAL | Status: AC | PRN

## 2014-02-16 MED ORDER — HYDROCODONE-ACETAMINOPHEN 5-325 MG PO TABS: 1.0000 | ORAL_TABLET | Freq: Four times a day (QID) | ORAL | Status: AC | PRN

## 2014-02-16 NOTE — Discharge Instructions (Signed)
1. Medications: robaxin, ibuprofen, vicodin, usual home medications 2. Treatment: rest, drink plenty of fluids, gentle stretching as discussed, alternate ice and heat 3. Follow Up: Please followup with your primary doctor in 3 days for discussion of your diagnoses and further evaluation after today's visit; if you do not have a primary care doctor use the resource guide provided to find one;  Return to the ER for worsening back pain, difficulty walking, loss of bowel or bladder control or other concerning symptoms     Thoracic Strain You have injured the muscles or tendons that attach to the upper part of your back behind your chest. This injury is called a thoracic strain, thoracic sprain, or mid-back strain.  CAUSES  The cause of thoracic strain varies. A less severe injury involves pulling a muscle or tendon without tearing it. A more severe injury involves tearing (rupturing) a muscle or tendon. With less severe injuries, there may be little loss of strength. Sometimes, there are breaks (fractures) in the bones to which the muscles are attached. These fractures are rare, unless there was a direct hit (trauma) or you have weak bones due to osteoporosis or age. Longstanding strains may be caused by overuse or improper form during certain movements. Obesity can also increase your risk for back injuries. Sudden strains may occur due to injury or not warming up properly before exercise. Often, there is no obvious cause for a thoracic strain. SYMPTOMS  The main symptom is pain, especially with movement, such as during exercise. DIAGNOSIS  Your caregiver can usually tell what is wrong by taking an X-ray and doing a physical exam. TREATMENT   Physical therapy may be helpful for recovery. Your caregiver can give you exercises to do or refer you to a physical therapist after your pain improves.  After your pain improves, strengthening and conditioning programs appropriate for your sport or occupation  may be helpful.  Always warm up before physical activities or athletics. Stretching after physical activity may also help.  Certain over-the-counter medicines may also help. Ask your caregiver if there are medicines that would help you. If this is your first thoracic strain injury, proper care and proper healing time before starting activities should prevent long-term problems. Torn ligaments and tendons require as long to heal as broken bones. Average healing times may be only 1 week for a mild strain. For torn muscles and tendons, healing time may be up to 6 weeks to 2 months. HOME CARE INSTRUCTIONS   Apply ice to the injured area. Ice massages may also be used as directed.  Put ice in a plastic bag.  Place a towel between your skin and the bag.  Leave the ice on for 15-20 minutes, 03-04 times a day, for the first 2 days.  Only take over-the-counter or prescription medicines for pain, discomfort, or fever as directed by your caregiver.  Keep your appointments for physical therapy if this was prescribed.  Use wraps and back braces as instructed. SEEK IMMEDIATE MEDICAL CARE IF:   You have an increase in bruising, swelling, or pain.  Your pain has not improved with medicines.  You develop new shortness of breath, chest pain, or fever.  Problems seem to be getting worse rather than better. MAKE SURE YOU:   Understand these instructions.  Will watch your condition.  Will get help right away if you are not doing well or get worse. Document Released: 07/04/2003 Document Revised: 07/06/2011 Document Reviewed: 05/30/2010 [facility] Patient Information 2015 [place]. This  information is not intended to replace advice given to you by your health care provider. Make sure you discuss any questions you have with your health care provider.  Lumbosacral Strain Lumbosacral strain is a strain of any of the parts that make up your lumbosacral vertebrae. Your lumbosacral vertebrae are the  bones that make up the lower third of your backbone. Your lumbosacral vertebrae are held together by muscles and tough, fibrous tissue (ligaments).  CAUSES  A sudden blow to your back can cause lumbosacral strain. Also, anything that causes an excessive stretch of the muscles in the low back can cause this strain. This is typically seen when people exert themselves strenuously, fall, lift heavy objects, bend, or crouch repeatedly. RISK FACTORS  Physically demanding work.  Participation in pushing or pulling sports or sports that require a sudden twist of the back (tennis, golf, baseball).  Weight lifting.  Excessive lower back curvature.  Forward-tilted pelvis.  Weak back or abdominal muscles or both.  Tight hamstrings. SIGNS AND SYMPTOMS  Lumbosacral strain may cause pain in the area of your injury or pain that moves (radiates) down your leg.  DIAGNOSIS Your health care provider can often diagnose lumbosacral strain through a physical exam. In some cases, you may need tests such as X-ray exams.  TREATMENT  Treatment for your lower back injury depends on many factors that your clinician will have to evaluate. However, most treatment will include the use of anti-inflammatory medicines. HOME CARE INSTRUCTIONS   Avoid hard physical activities (tennis, racquetball, waterskiing) if you are not in proper physical condition for it. This may aggravate or create problems.  If you have a back problem, avoid sports requiring sudden body movements. Swimming and walking are generally safer activities.  Maintain good posture.  Maintain a healthy weight.  For acute conditions, you may put ice on the injured area.  Put ice in a plastic bag.  Place a towel between your skin and the bag.  Leave the ice on for 20 minutes, 2-3 times a day.  When the low back starts healing, stretching and strengthening exercises may be recommended. SEEK MEDICAL CARE IF:  Your back pain is getting  worse.  You experience severe back pain not relieved with medicines. SEEK IMMEDIATE MEDICAL CARE IF:   You have numbness, tingling, weakness, or problems with the use of your arms or legs.  There is a change in bowel or bladder control.  You have increasing pain in any area of the body, including your belly (abdomen).  You notice shortness of breath, dizziness, or feel faint.  You feel sick to your stomach (nauseous), are throwing up (vomiting), or become sweaty.  You notice discoloration of your toes or legs, or your feet get very cold. MAKE SURE YOU:   Understand these instructions.  Will watch your condition.  Will get help right away if you are not doing well or get worse. Document Released: 01/21/2005 Document Revised: 04/18/2013 Document Reviewed: 11/30/2012 [facility] Patient Information 2015 [employer], [place]. This information is not intended to replace advice given to you by your health care provider. Make sure you discuss any questions you have with your health care provider.    Emergency Department Resource Guide 1) Find a Doctor and Pay Out of Pocket Although you won't have to find out who is covered by your insurance plan, it is a good idea to ask around and get recommendations. You will then need to call the office and see if the doctor you have  chosen will accept you as a new patient and what types of options they offer for patients who are self-pay. Some doctors offer discounts or will set up payment plans for their patients who do not have insurance, but you will need to ask so you aren't surprised when you get to your appointment.  2) Contact Your Local Health Department Not all health departments have doctors that can see patients for sick visits, but many do, so it is worth a call to see if yours does. If you don't know where your local health department is, you can check in your phone book. The CDC also has a tool to help you locate your state's health department, and  many state websites also have listings of all of their local health departments.  3) Find a Walk-in Clinic If your illness is not likely to be very severe or complicated, you may want to try a walk in clinic. These are popping up all over the country in pharmacies, drugstores, and shopping centers. They're usually staffed by nurse practitioners or physician assistants that have been trained to treat common illnesses and complaints. They're usually fairly quick and inexpensive. However, if you have serious medical issues or chronic medical problems, these are probably not your best option.  No Primary Care Doctor: - Call Health Connect at  [phone] - they can help you locate a primary care doctor that  accepts your insurance, provides certain services, etc. - Physician Referral Service- [phone]  Chronic Pain Problems: Organization         Address  Phone   Notes  [facility]  [phone] Patients need to be referred by their primary care doctor.   Medication Assistance: Organization         Address  Phone   Notes  [facility] Medication [facility] [address] [place], [place] [address] [phone] --Must be a resident of [facility] -- Must have NO insurance coverage whatsoever (no Medicaid/ Medicare, etc.) -- The pt. MUST have a primary care doctor that directs their care regularly and follows them in the community   [facility]  [phone]   [employer]  [phone]    Agencies that provide inexpensive medical care: Organization         Address  Phone   Notes  [facility]  [phone]   [facility]    [phone]   [facility] [address] [place], [place] [address] [phone]   [facility] [address] [place] [phone]   Planned Parenthood    [phone]   [facility]    [phone]   Community Health  and [facility]  [address], [place] Phone:  [phone], Fax:  [phone] Hours of Operation:  9 am - 6 pm, M-F.  Also accepts Medicaid/Medicare and self-pay.  [facility]  [address],  Phone: [phone], Fax: [phone]. Hours of Operation:  8:30 am - 5:30 pm, M-F.  Also accepts Medicaid and self-pay.  [facility] [place] [address] [place] Phone: [phone]   [facility] [address] [place], [place] [phone], Ext. [phone] Mondays & Thursdays: 7-9 AM.  First 15 patients are seen on a first come, first serve basis.    Medicaid-accepting [facility] Providers:  Organization  Address  Phone   Notes  [facility] [address], [place] [phone] Also accepts self-pay patients.  [facility] [address]urell Josephs [place], [place]  [phone]   [facility] [address], [place] [phone]   [facility] [address] [place] [phone]   Renaye Rakers [address], [place]   [phone] Only accepts [place] Access [place] patients after they have their name applied to their card.   Self-Pay (no insurance) in [facility]:  Organization         Address  Phone   Notes  Sickle Cell Patients, [facility] Internal Medicine [address] [place], [place] [phone]   [facility] [address] [place], [place] [phone]   [facility] [place]  [address],  [phone]   [facility]/Dr. Osei-Bonsu  [address] [place] [address] [place] or [address], [place] [phone] Phone number for both [place] and [place] locations is the same.  [facility] [address], [place] [phone]   [facility]  [address] [place] or [address] [phone]   [facility] [address], [place] [phone], phone; [phone], fax Sees patients 1st and 3rd Saturday of every month.  Must not qualify for public or private insurance (i.e. Medicaid, Medicare, [place] Health Choice, Veterans' Benefits)  Household income should be no more than 200% of the poverty level The clinic cannot treat you if you are pregnant or think you are pregnant  Sexually transmitted diseases are not treated at the clinic.   Dental Care: Organization         Address  Phone  Notes  [facility] Department of [facility] [address] [place], [place] [phone] Accepts children up to age 68 who are enrolled in [place] or [place] Health Choice; pregnant women with a Medicaid card; and children who have applied for Medicaid or [place] Health Choice, but were declined, whose parents can pay a reduced fee at time of service.  [facility] Department of [facility]  [address], [place] [phone] Accepts children up to age 11 who are enrolled in [place] or [place] Health Choice; pregnant women with a Medicaid card; and children who have applied for Medicaid or [place] Health Choice, but were declined, whose parents can pay a reduced fee at time of service.  [facility]  [address] [place], [place] [phone] Patients are seen by appointment only. Walk-ins are not accepted. [facility] will see patients 29 years of age and older. Monday - Tuesday (8am-5pm) Most Wednesdays (8:30-5pm) $30 per visit, cash only  [facility] Adult Dental Access [facility]  [address], [facility] [phone] Patients are seen by appointment only. Walk-ins are not accepted. [facility] will see patients 83 years of age and older. One Wednesday Evening (Monthly: Volunteer Based).  $30 per visit, cash only   [employer]  [phone] for adults; Children under age 85, call Graduate Pediatric Dentistry at [phone]. Children aged 51-14, please call [phone] to request a pediatric application.  Dental services are provided in all areas of dental care including fillings,  crowns and bridges, complete and partial dentures, implants, gum treatment, root canals, and extractions. Preventive care is also provided. Treatment is provided to both adults and children. Patients are selected via a lottery and there is often a waiting list.   [facility] [address], [place]  [phone] [email or web address]   [facility] [address] [place], [place] [phone], Ext. [phone] Second and Fourth Thursday of each month, opens at 6:30 AM; Clinic ends at 9 AM.  Patients are seen on a first-come first-served basis, and a limited number are seen during each clinic.   [facility]  [address] [place], [place] [phone]   Eligibility Requirements You must have lived in [place], [place], or [place] counties for at least the last three months.   You cannot be eligible for state or federal sponsored [place], including [employer], [place], or [employer].   You generally cannot be eligible for healthcare insurance through your employer.    How to apply: Eligibility screenings are held every Tuesday and Wednesday afternoon from 1:00 pm until 4:00 pm. You do not need an appointment for the interview!  [facility] [address] [place], [place] [phone]   [facility] Health Department  [phone]   [facility] Health Department  [phone]   [facility] Health Department  [phone]    Behavioral Health Resources in the Community: Intensive Outpatient Programs Organization         Address  Phone  Notes  [facility] [address] [place],  [place] [phone]   [facility] [address], [place], [place] [phone]   [facility] [address] [place], [place]  [phone]   [facility] Mental Health [address],  [place], [place] [phone] or [phone]   Substance Abuse Resources Organization         Address  Phone  Notes  [facility]  [phone]   [facility]  [phone]   [facility]  [phone]   Floydene Flock  [phone]   Residential & Outpatient Substance Abuse Program  [phone]   Psychological Services Organization         Address  Phone  Notes  [facility] Behavioral Health  [phone]   [facility]  [phone]   [facility] Mental Health [address], [place] [phone] or [phone]    Mobile Crisis Teams Organization         Address  Phone  Notes  [facility], Mobile Crisis Care Unit  [phone]   [facility]  [address] [place], [place] [phone]   Doristine Locks [address] [place] [phone]    Self-Help/Support Groups Organization         Address  Phone             Notes  [facility] [place] - variety of support groups  336- I7437963 Call for more information  Narcotics Anonymous (NA), [facility] [address], [employer] [place]  2 meetings at this location   Statistician         Address  Phone  Notes  [facility] [address],    [place]  [phone]   [facility]  [address], [place] [address], [place], [place] [phone]   [facility] [address] [place], [place]  [phone] Admissions: 8am-3pm M-F  [facility] [address]    [place], [place] [phone]   [facility] [address], [place], [place] [phone]   [facility] [address]  [place], [place] [phone]   [facility] - Intensive Outpatient [address], [place], [place] [phone]   [facility] ([facility] [address]  [place], [place] [phone] or [phone]   [facility] ([facility]) [address] [place], [place] [phone] Accepts Medicaid  [facility] [address]  [place] [phone] Substance Abuse/Addiction Treatment   [facility]al Health Resources Organization         Address  Phone  Notes  [facility]  [phone]   Angie FavaJulie Brannon, PhD [address] [place] [address] [place], [place]   [phone] or [phone]   [facility]   [address] [place], [place] [phone]   [facility] [address], [place], [place] [phone]) [phone] Insurance/Medicaid/sponsorship through [facility]  [facility] [address]                                    [place], [place] [phone] Therapy/tele-psych/case  [facility] [address]   [place], [place] [phone]    Dr. Lolly MustacheArfeen  [phone]) [phone]   [facility]  [employer] [facility] 1) [address], [place] 2) [address], [place] 3)  [address], [place] [phone]  [phone]   [facility] Child Abuse Hotline [phone](336) [phone] or [phone]) [phone] (After Hours)

## 2014-02-17 NOTE — ED Provider Notes (Signed)
Medical screening examination/treatment/procedure(s) were performed by non-physician practitioner and as supervising physician I was immediately available for consultation/collaboration.   EKG Interpretation None        Audree CamelScott T Godfrey Tritschler, MD 02/17/14 1524

## 2014-07-27 DIAGNOSIS — Z0279 Encounter for issue of other medical certificate: Secondary | ICD-10-CM

## 2014-08-29 ENCOUNTER — Other Ambulatory Visit: Payer: Self-pay | Admitting: Internal Medicine

## 2014-08-29 DIAGNOSIS — R945 Abnormal results of liver function studies: Principal | ICD-10-CM

## 2014-08-29 DIAGNOSIS — R7989 Other specified abnormal findings of blood chemistry: Secondary | ICD-10-CM

## 2014-10-02 ENCOUNTER — Ambulatory Visit
Admission: RE | Admit: 2014-10-02 | Discharge: 2014-10-02 | Disposition: A | Discharge status: Home / Self Care | Payer: Self-pay | Source: Ambulatory Visit | Attending: Internal Medicine | Admitting: Internal Medicine

## 2014-10-02 DIAGNOSIS — R945 Abnormal results of liver function studies: Principal | ICD-10-CM

## 2014-10-02 DIAGNOSIS — R7989 Other specified abnormal findings of blood chemistry: Secondary | ICD-10-CM

## 2014-11-12 ENCOUNTER — Ambulatory Visit: Payer: Self-pay | Admitting: Dietician

## 2016-09-28 IMAGING — CR DG THORACIC SPINE 2V
5 series · 5 of 5 positions shown · non-contrast
Comparison: None.

CLINICAL DATA: Initial evaluation for acute pain around T7. Status
post fall.

EXAM:
THORACIC SPINE - 2 VIEW

[t t-spine a.p. (1 of 2)]
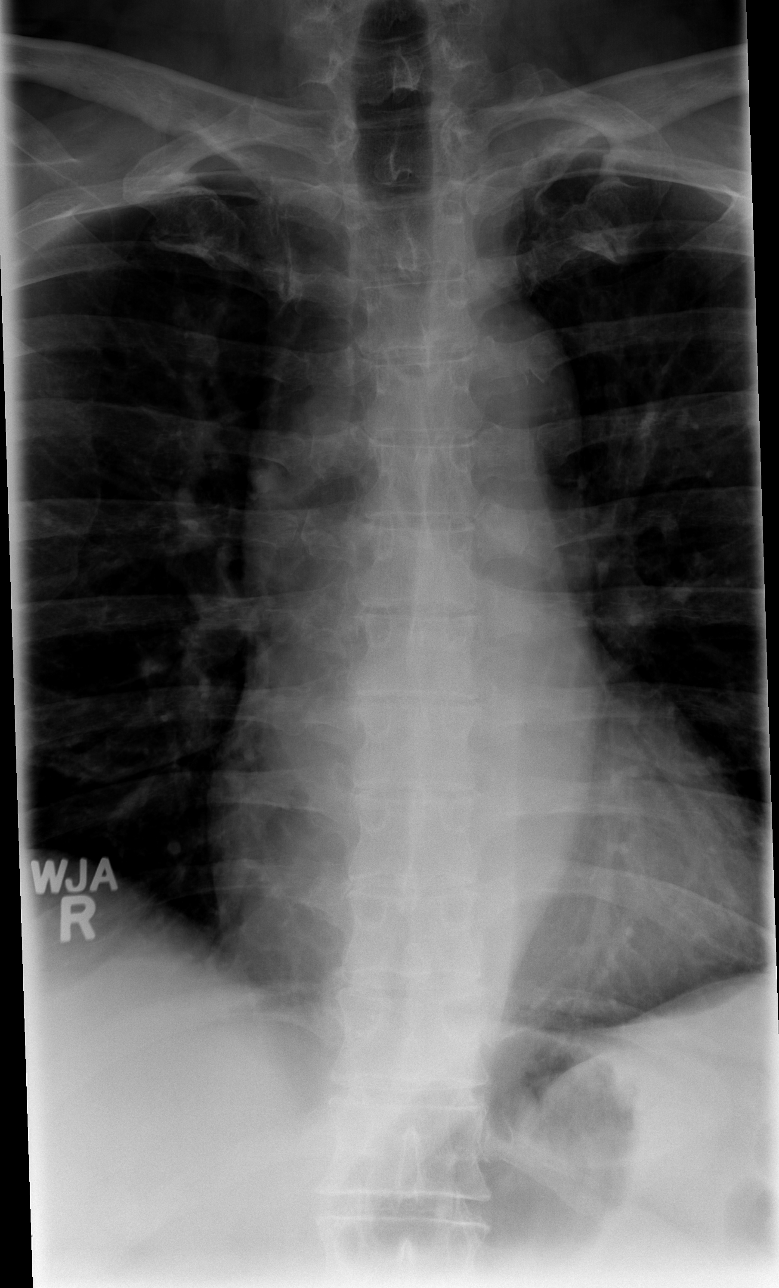

[t t-spine a.p. (2 of 2)]
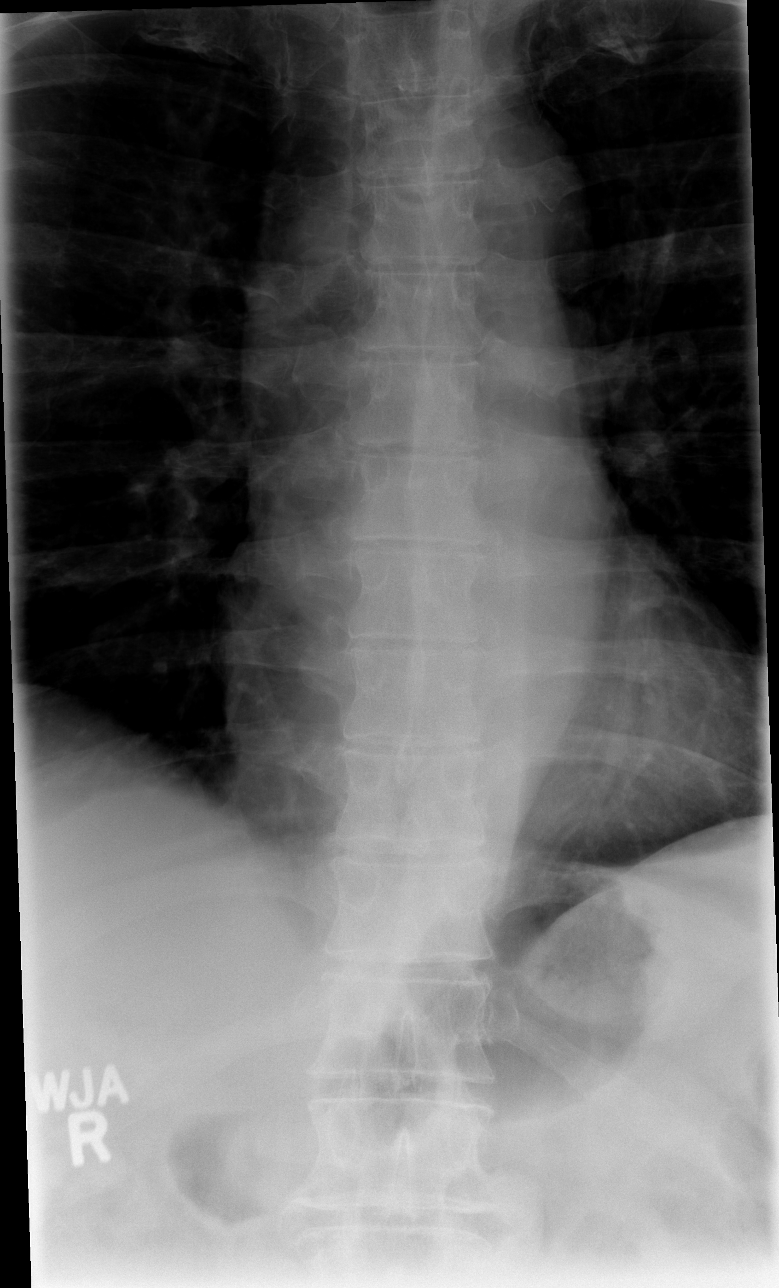

[t t-spine lat (1 of 2)]
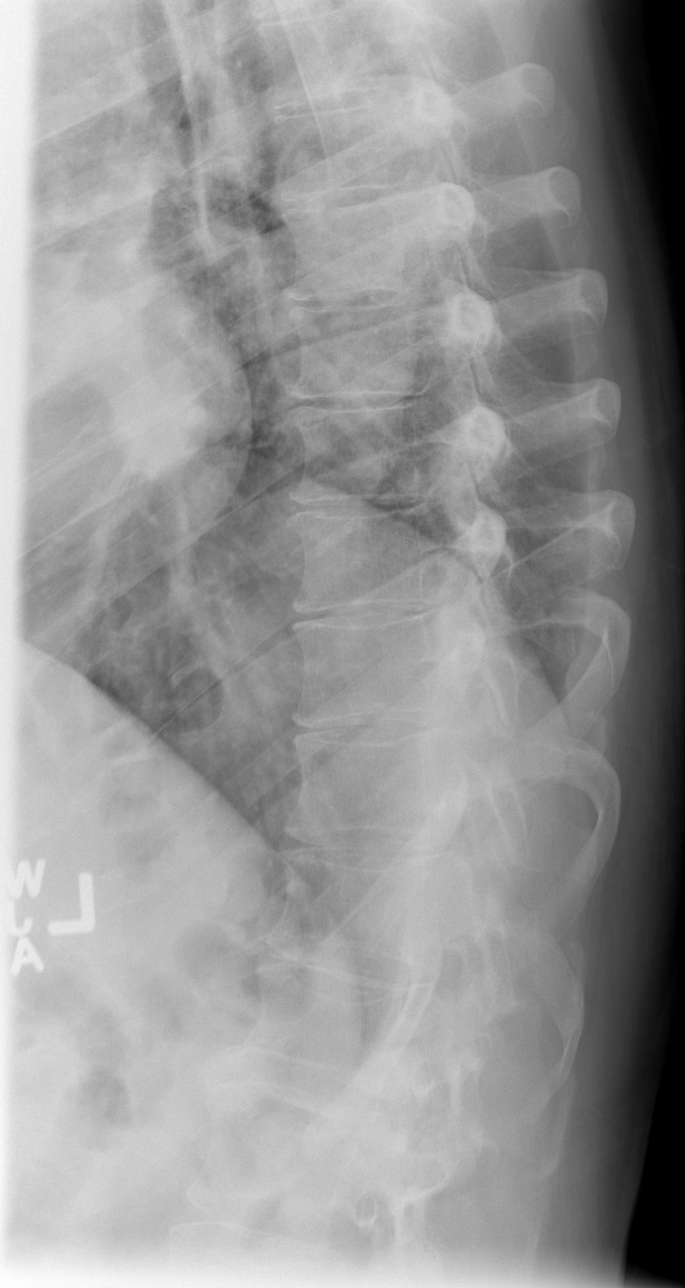

[t t-spine lat (2 of 2)]
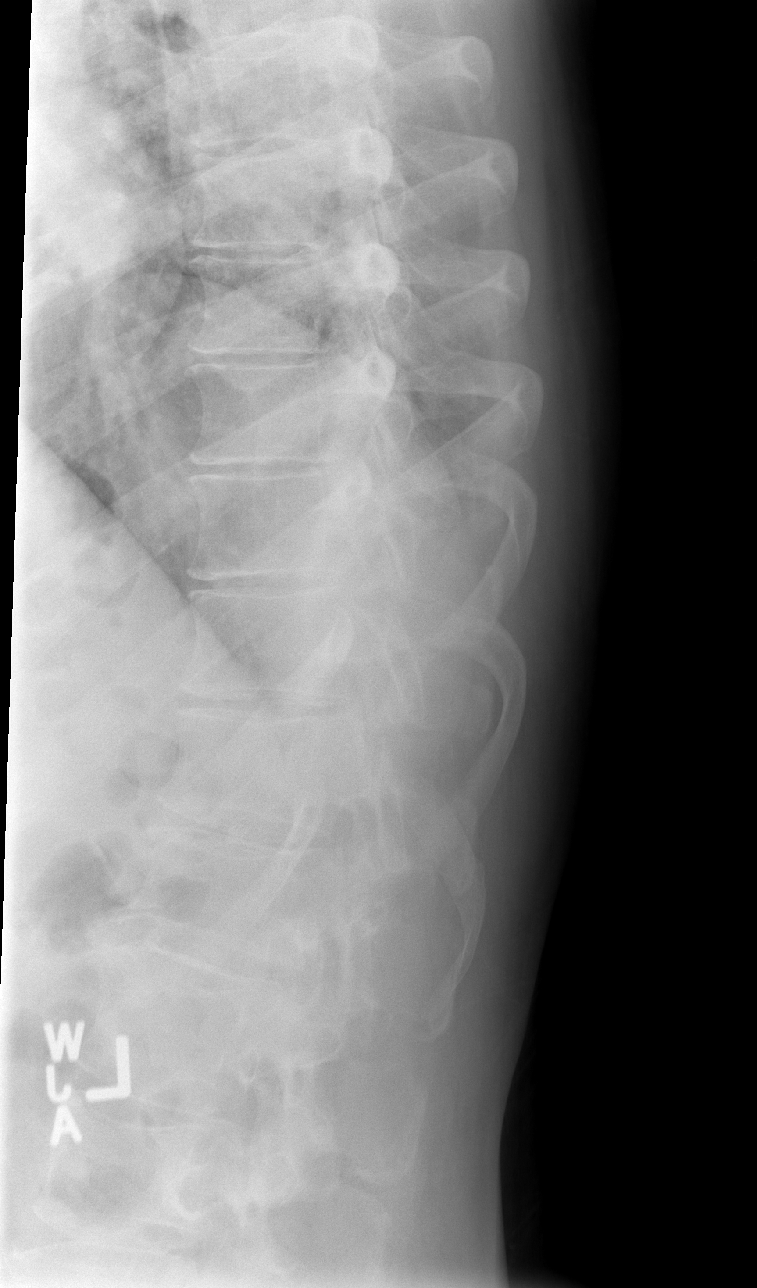

[t swimmers]
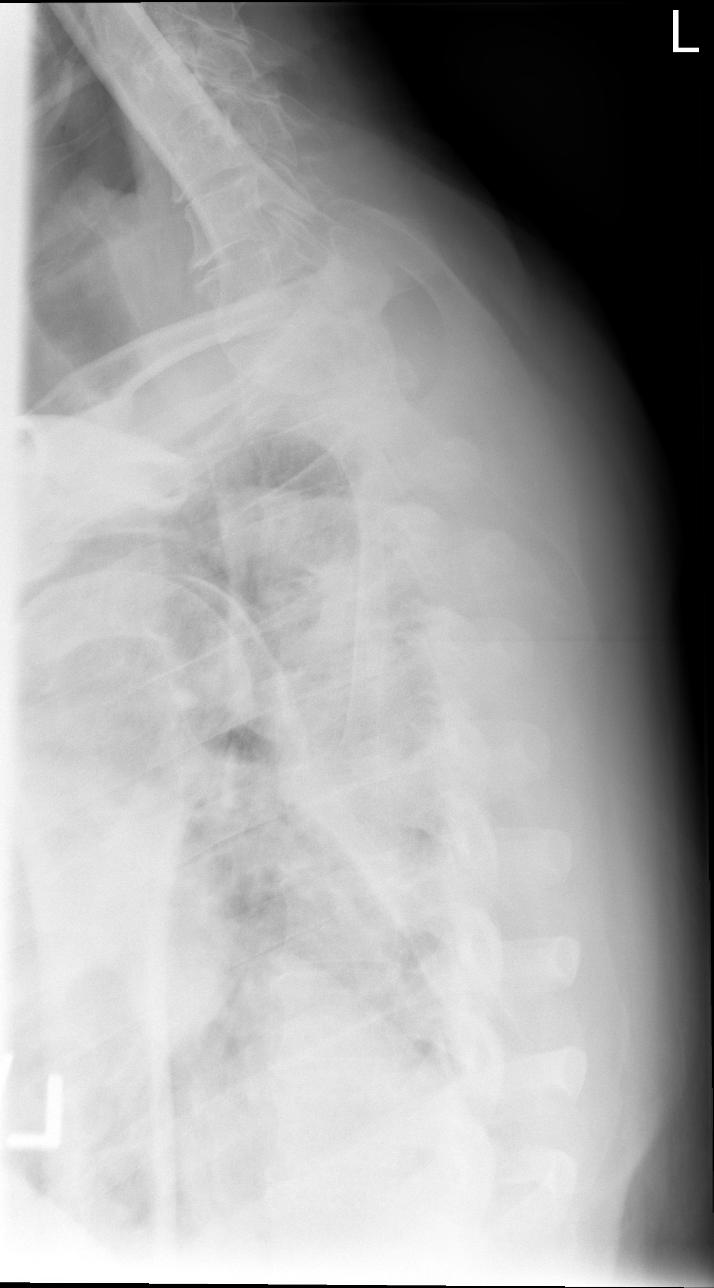

[5 of 5 positions shown; findings below may reference images not displayed]

FINDINGS: There is no evidence of thoracic spine fracture. Alignment is
normal. No other significant bone abnormalities are identified.
IMPRESSION: Negative.

## 2016-10-08 DIAGNOSIS — R7309 Other abnormal glucose: Secondary | ICD-10-CM | POA: Diagnosis not present

## 2016-10-08 DIAGNOSIS — E7800 Pure hypercholesterolemia, unspecified: Secondary | ICD-10-CM | POA: Diagnosis not present

## 2016-10-08 DIAGNOSIS — E039 Hypothyroidism, unspecified: Secondary | ICD-10-CM | POA: Diagnosis not present

## 2016-10-15 DIAGNOSIS — E039 Hypothyroidism, unspecified: Secondary | ICD-10-CM | POA: Diagnosis not present

## 2016-10-15 DIAGNOSIS — R7303 Prediabetes: Secondary | ICD-10-CM | POA: Diagnosis not present

## 2016-10-15 DIAGNOSIS — J4520 Mild intermittent asthma, uncomplicated: Secondary | ICD-10-CM | POA: Diagnosis not present

## 2016-10-15 DIAGNOSIS — Z86718 Personal history of other venous thrombosis and embolism: Secondary | ICD-10-CM | POA: Diagnosis not present

## 2016-12-08 DIAGNOSIS — E7800 Pure hypercholesterolemia, unspecified: Secondary | ICD-10-CM | POA: Diagnosis not present

## 2016-12-08 DIAGNOSIS — R7303 Prediabetes: Secondary | ICD-10-CM | POA: Diagnosis not present

## 2016-12-08 DIAGNOSIS — Z125 Encounter for screening for malignant neoplasm of prostate: Secondary | ICD-10-CM | POA: Diagnosis not present

## 2016-12-08 DIAGNOSIS — E039 Hypothyroidism, unspecified: Secondary | ICD-10-CM | POA: Diagnosis not present

## 2016-12-15 DIAGNOSIS — E669 Obesity, unspecified: Secondary | ICD-10-CM | POA: Diagnosis not present

## 2016-12-15 DIAGNOSIS — E7800 Pure hypercholesterolemia, unspecified: Secondary | ICD-10-CM | POA: Diagnosis not present

## 2016-12-15 DIAGNOSIS — E039 Hypothyroidism, unspecified: Secondary | ICD-10-CM | POA: Diagnosis not present

## 2016-12-15 DIAGNOSIS — R7309 Other abnormal glucose: Secondary | ICD-10-CM | POA: Diagnosis not present

## 2017-06-01 ENCOUNTER — Telehealth: Payer: Self-pay | Admitting: Vascular Surgery

## 2017-06-01 ENCOUNTER — Telehealth: Payer: Self-pay | Admitting: *Deleted

## 2017-06-01 ENCOUNTER — Other Ambulatory Visit: Payer: Self-pay | Admitting: *Deleted

## 2017-06-01 DIAGNOSIS — I809 Phlebitis and thrombophlebitis of unspecified site: Secondary | ICD-10-CM

## 2017-06-01 NOTE — Telephone Encounter (Signed)
Pt's wife called in saying her husband has a firm and painful lump on the inner aspect of his right calf. He also has increased swelling of his foot and ankle. 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 Recently, he was in the car sitting for prolonged time ([place]) rather than being active. The onset was yesterday. I told her to elevate his leg and apply heat to the calf. He does not wear compression stockings at this time. We will see him for an ultrasound and a visit with one of the surgeons.

## 2017-06-01 NOTE — Telephone Encounter (Signed)
Sched appt 06/02/17; lab at 9:00 and MD at 10:00. Called 2x, no vm, emerg contact is same #, pt does not have mychart.

## 2017-06-01 NOTE — Telephone Encounter (Signed)
-----   Message from Micki RileyElisabeth Wert, RN sent at 06/01/2017 11:47 AM EST ----- This pt needs a r/o DVT study and md visit asap R leg. Order is in. Thx

## 2017-06-02 ENCOUNTER — Ambulatory Visit: Payer: Self-pay | Admitting: Vascular Surgery

## 2017-06-02 ENCOUNTER — Encounter (HOSPITAL_COMMUNITY): Payer: Self-pay

## 2017-06-03 ENCOUNTER — Ambulatory Visit (HOSPITAL_COMMUNITY)
Admission: RE | Admit: 2017-06-03 | Discharge: 2017-06-03 | Disposition: A | Discharge status: Home / Self Care | Payer: Medicare HMO | Source: Ambulatory Visit | Attending: Vascular Surgery | Admitting: Vascular Surgery

## 2017-06-03 ENCOUNTER — Encounter: Payer: Self-pay | Admitting: Vascular Surgery

## 2017-06-03 ENCOUNTER — Ambulatory Visit (INDEPENDENT_AMBULATORY_CARE_PROVIDER_SITE_OTHER): Payer: Medicare HMO | Admitting: Vascular Surgery

## 2017-06-03 VITALS — BP 140/86 | HR 69 | Temp 98.1°F | Resp 20 | Ht 75.0 in | Wt 307.2 lb

## 2017-06-03 DIAGNOSIS — I809 Phlebitis and thrombophlebitis of unspecified site: Secondary | ICD-10-CM

## 2017-06-03 DIAGNOSIS — I82811 Embolism and thrombosis of superficial veins of right lower extremities: Secondary | ICD-10-CM | POA: Diagnosis not present

## 2017-06-03 DIAGNOSIS — I8001 Phlebitis and thrombophlebitis of superficial vessels of right lower extremity: Secondary | ICD-10-CM | POA: Diagnosis not present

## 2017-06-03 NOTE — Progress Notes (Signed)
Patient name: William PeopleRobert Martinez MRN: [id number] DOB: 06/10/1955 Sex: male  REASON FOR CONSULT: Thrombophlebitis right leg  HPI: William Martinez is a 62 y.o. 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  He recently developed a new lump in his right medial calf.  He really denies any pain inflammation warmth in this area.  He states that this occurred after a long car ride.  He denies any shortness of breath or hemoptysis.  He does have a long history of venous problems and was evaluated by my partner Dr. Hart RochesterLawson in 2012.  He currently is on Xarelto.  Other medical problems include deafness, COPD both of which are stable.  He intermittently wears compression stockings but is not really compliant with these.  Past Medical History:  Diagnosis Date  . Acquired deafness    childhood infection  . Bronchitis   . COPD (chronic obstructive pulmonary disease) (HCC)   . DVT (deep venous thrombosis) (HCC)   . Thrombophlebitis   . Varicose veins    Past Surgical History:  Procedure Laterality Date  . ELBOW SURGERY    . FOOT SURGERY  May 2014   Left  . phlebectomy    . shoulder susrgery    . vein stripping      Family History  Problem Relation Age of Onset  . Cancer Father   . Coronary artery disease Father   . Deep vein thrombosis Mother   . Stroke Mother   . Diabetes Sister   . Diabetes Brother     SOCIAL HISTORY: Social History   Socioeconomic History  . Marital status: Married    Spouse name: Not on file  . Number of children: Not on file  . Years of education: Not on file  . Highest education level: Not on file  Social Needs  . Financial resource strain: Not on file  . Food insecurity - worry: Not on file  . Food insecurity - inability: Not on file  . Transportation needs - medical: Not on file  . Transportation needs - non-medical: Not on file  Occupational History  . Not on file  Tobacco Use  . Smoking status: Former Smoker   Types: Cigarettes    Last attempt to quit: 04/27/1988    Years since quitting: 29.1  . Smokeless tobacco: Never Used  Substance and Sexual Activity  . Alcohol use: Yes    Comment: occasionally 1-2 can beer daily  . Drug use: No  . Sexual activity: Yes  Other Topics Concern  . Not on file  Social History Narrative  . Not on file    Allergies  Allergen Reactions  . Codeine     REACTION: alter moods  . Darvocet [Propoxyphene N-Acetaminophen]     Mood changes  . Percocet [Oxycodone-Acetaminophen]     Mood changes    Current Outpatient Medications  Medication Sig Dispense Refill  . Levothyroxine Sodium 75 MCG CAPS Take by mouth daily before breakfast.    . rivaroxaban (XARELTO) 20 MG TABS tablet Take 20 mg by mouth daily with supper.    Marland Kitchen. albuterol (PROVENTIL HFA;VENTOLIN HFA) 108 (90 BASE) MCG/ACT inhaler Inhale 1-2 puffs into the lungs every 6 (six) hours as needed for wheezing or shortness of breath. (Patient not taking: Reported on 06/03/2017) 1 Inhaler 0  . allopurinol (ZYLOPRIM) 100 MG tablet Take 100 mg by mouth daily.    Marland Kitchen. azithromycin (ZITHROMAX) 250 MG tablet Take 1 tablet (250 mg  total) by mouth daily. Take first 2 tablets together, then 1 every day until finished. (Patient not taking: Reported on 06/03/2017) 6 tablet 0  . benzonatate (TESSALON) 200 MG capsule Take 200 mg by mouth 3 (three) times daily as needed for cough.    . clonazePAM (KLONOPIN) 1 MG tablet Take 0.5 mg by mouth at bedtime as needed for anxiety.    . colchicine 0.6 MG tablet Take 0.6 mg by mouth daily.    Marland Kitchen HYDROcodone-acetaminophen (NORCO/VICODIN) 5-325 MG per tablet Take 1-2 tablets by mouth every 6 (six) hours as needed (Take 1 - 2 tablets every 4 - 6 hours.). (Patient not taking: Reported on 06/03/2017) 30 tablet 0  . ibuprofen (ADVIL,MOTRIN) 800 MG tablet Take 1 tablet (800 mg total) by mouth every 8 (eight) hours as needed. (Patient not taking: Reported on 06/03/2017) 30 tablet 0  . methocarbamol (ROBAXIN)  750 MG tablet Take 1 tablet (750 mg total) by mouth 4 (four) times daily as needed for muscle spasms (Take 1 tablet every 6 hours as needed for muscle spasms.). (Patient not taking: Reported on 06/03/2017) 20 tablet 0  . naproxen (NAPROSYN) 500 MG tablet Take 1 tablet (500 mg total) by mouth 2 (two) times daily. (Patient not taking: Reported on 06/03/2017) 20 tablet 0  . predniSONE (DELTASONE) 20 MG tablet Take 20 mg by mouth daily with breakfast.    . warfarin (COUMADIN) 6 MG tablet Take 6-9 mg by mouth daily. Tuesday-Sunday 9mg, Monday 6mg    . zolpidem (AMBIEN) 10 MG tablet Take 10 mg by mouth at bedtime as needed for sleep.     No current facility-administered medications for this visit.     ROS:   General:  No weight loss, Fever, chills  HEENT: No recent headaches, no nasal bleeding, no visual changes, no sore throat  Neurologic: No dizziness, blackouts, seizures. No recent symptoms of stroke or mini- stroke. No recent episodes of slurred speech, or temporary blindness.  Cardiac: No recent episodes of chest pain/pressure, no shortness of breath at rest.  No shortness of breath with exertion.  Denies history of atrial fibrillation or irregular heartbeat  Vascular: No history of rest pain in feet.  No history of claudication.  No history of non-healing ulcer, + history of DVT   Pulmonary: No home oxygen, no productive cough, no hemoptysis,  No asthma or wheezing  Musculoskeletal:  [ ] Arthritis, [ ] Low back pain,  [ ] Joint pain  Hematologic:No history of hypercoagulable state.  No history of easy bleeding.  No history of anemia  Gastrointestinal: No hematochezia or melena,  No gastroesophageal reflux, no trouble swallowing  Urinary: [ ] chronic Kidney disease, [ ] on HD - [ ] MWF orTTHS, [ ] Burning with urination, [ ] Frequent urination, [ ] Difficulty urinating;   Skin: No rashes  Psychological: No history of anxiety,  No history of depression   Physical Examination  Vitals:     02 /07/19 0904  BP: 140/86  Pulse: 69  Resp: 20  Temp: 98.1 F (36.7 C)  TempSrc: Oral  SpO2: 93%  Weight: (!) 307 lb 3.2 oz (139.3 kg)  Height: 6\' 3"  (1.905 m)    Body mass index is 38.4 kg/m.  General:  Alert and oriented, no acute distress HEENT: Normal Neck: No bruit or JVD Pulmonary: Clear to auscultation bilaterally Cardiac: Regular Rate and Rhythm without murmur Abdomen: Soft, non-tender, non-distended, no mass, obese Skin: No rash, scattered reticular and small varicosities bilateral lower  extremities right greater than left 2 x 2 centimeter mass nontender noninflamed right medial calf consistent with thrombosed varicosity Extremity Pulses:  2+ radial, brachial, femoral, dorsalis pedis, posterior tibial pulses bilaterally Musculoskeletal: No deformity trace edema  Neurologic: Upper and lower extremity motor 5/5 and symmetric  DATA:  Patient had a venous duplex exam today which showed no evidence of DVT.  There was thrombosis of a vein over the area of the mass.  This was consistent with a superficial thrombophlebitis.  ASSESSMENT: Superficial thrombophlebitis right lower extremity not really symptomatic no evidence of DVT   PLAN: Continue Xarelto for DVT prophylaxis.  Patient will follow up with [place] on an as-needed basis.  He was encouraged to wear compression stockings to prevent future events.  He was also counseled to try some weight loss and he does have an exercise program in place to try to deal with this.   Fabienne Bruns, MD [facility] Office: [phone] Pager: [phone]

## 2017-06-21 DIAGNOSIS — E7800 Pure hypercholesterolemia, unspecified: Secondary | ICD-10-CM | POA: Diagnosis not present

## 2017-06-21 DIAGNOSIS — J449 Chronic obstructive pulmonary disease, unspecified: Secondary | ICD-10-CM | POA: Diagnosis not present

## 2017-06-21 DIAGNOSIS — J988 Other specified respiratory disorders: Secondary | ICD-10-CM | POA: Diagnosis not present

## 2017-07-05 DIAGNOSIS — Z7901 Long term (current) use of anticoagulants: Secondary | ICD-10-CM | POA: Diagnosis not present

## 2017-07-05 DIAGNOSIS — E039 Hypothyroidism, unspecified: Secondary | ICD-10-CM | POA: Diagnosis not present

## 2017-07-05 DIAGNOSIS — E7800 Pure hypercholesterolemia, unspecified: Secondary | ICD-10-CM | POA: Diagnosis not present

## 2017-07-05 DIAGNOSIS — E669 Obesity, unspecified: Secondary | ICD-10-CM | POA: Diagnosis not present

## 2017-07-05 DIAGNOSIS — J449 Chronic obstructive pulmonary disease, unspecified: Secondary | ICD-10-CM | POA: Diagnosis not present

## 2017-10-20 DIAGNOSIS — M25562 Pain in left knee: Secondary | ICD-10-CM | POA: Diagnosis not present

## 2017-10-20 DIAGNOSIS — M1712 Unilateral primary osteoarthritis, left knee: Secondary | ICD-10-CM | POA: Diagnosis not present

## 2017-10-22 DIAGNOSIS — M25562 Pain in left knee: Secondary | ICD-10-CM | POA: Diagnosis not present

## 2017-10-29 DIAGNOSIS — M25562 Pain in left knee: Secondary | ICD-10-CM | POA: Diagnosis not present

## 2017-11-04 DIAGNOSIS — M25562 Pain in left knee: Secondary | ICD-10-CM | POA: Diagnosis not present

## 2017-11-09 DIAGNOSIS — M25562 Pain in left knee: Secondary | ICD-10-CM | POA: Diagnosis not present

## 2017-11-11 DIAGNOSIS — M25562 Pain in left knee: Secondary | ICD-10-CM | POA: Diagnosis not present

## 2017-11-16 DIAGNOSIS — M25562 Pain in left knee: Secondary | ICD-10-CM | POA: Diagnosis not present

## 2017-11-18 DIAGNOSIS — M25562 Pain in left knee: Secondary | ICD-10-CM | POA: Diagnosis not present

## 2017-11-22 DIAGNOSIS — M25562 Pain in left knee: Secondary | ICD-10-CM | POA: Diagnosis not present

## 2017-11-23 DIAGNOSIS — E039 Hypothyroidism, unspecified: Secondary | ICD-10-CM | POA: Diagnosis not present

## 2017-11-23 DIAGNOSIS — Z0000 Encounter for general adult medical examination without abnormal findings: Secondary | ICD-10-CM | POA: Diagnosis not present

## 2017-11-23 DIAGNOSIS — Z23 Encounter for immunization: Secondary | ICD-10-CM | POA: Diagnosis not present

## 2017-11-24 DIAGNOSIS — R635 Abnormal weight gain: Secondary | ICD-10-CM | POA: Diagnosis not present

## 2017-11-24 DIAGNOSIS — J45909 Unspecified asthma, uncomplicated: Secondary | ICD-10-CM | POA: Diagnosis not present

## 2017-11-24 DIAGNOSIS — M179 Osteoarthritis of knee, unspecified: Secondary | ICD-10-CM | POA: Diagnosis not present

## 2017-11-24 DIAGNOSIS — J449 Chronic obstructive pulmonary disease, unspecified: Secondary | ICD-10-CM | POA: Diagnosis not present

## 2017-11-24 DIAGNOSIS — Z0000 Encounter for general adult medical examination without abnormal findings: Secondary | ICD-10-CM | POA: Diagnosis not present

## 2017-11-24 DIAGNOSIS — I8390 Asymptomatic varicose veins of unspecified lower extremity: Secondary | ICD-10-CM | POA: Diagnosis not present

## 2017-11-24 DIAGNOSIS — Z87891 Personal history of nicotine dependence: Secondary | ICD-10-CM | POA: Diagnosis not present

## 2017-11-30 DIAGNOSIS — R011 Cardiac murmur, unspecified: Secondary | ICD-10-CM | POA: Diagnosis not present

## 2017-11-30 DIAGNOSIS — R7303 Prediabetes: Secondary | ICD-10-CM | POA: Diagnosis not present

## 2017-11-30 DIAGNOSIS — Z8249 Family history of ischemic heart disease and other diseases of the circulatory system: Secondary | ICD-10-CM | POA: Diagnosis not present

## 2017-11-30 DIAGNOSIS — R9431 Abnormal electrocardiogram [ECG] [EKG]: Secondary | ICD-10-CM | POA: Diagnosis not present

## 2017-12-06 DIAGNOSIS — R011 Cardiac murmur, unspecified: Secondary | ICD-10-CM | POA: Diagnosis not present

## 2017-12-06 DIAGNOSIS — R9431 Abnormal electrocardiogram [ECG] [EKG]: Secondary | ICD-10-CM | POA: Diagnosis not present

## 2017-12-09 DIAGNOSIS — R9431 Abnormal electrocardiogram [ECG] [EKG]: Secondary | ICD-10-CM | POA: Diagnosis not present

## 2017-12-09 DIAGNOSIS — E119 Type 2 diabetes mellitus without complications: Secondary | ICD-10-CM | POA: Diagnosis not present

## 2017-12-09 DIAGNOSIS — Z136 Encounter for screening for cardiovascular disorders: Secondary | ICD-10-CM | POA: Diagnosis not present

## 2017-12-17 DIAGNOSIS — R945 Abnormal results of liver function studies: Secondary | ICD-10-CM | POA: Diagnosis not present

## 2017-12-17 DIAGNOSIS — E785 Hyperlipidemia, unspecified: Secondary | ICD-10-CM | POA: Diagnosis not present

## 2017-12-17 DIAGNOSIS — I10 Essential (primary) hypertension: Secondary | ICD-10-CM | POA: Diagnosis not present

## 2017-12-28 DIAGNOSIS — R600 Localized edema: Secondary | ICD-10-CM | POA: Diagnosis not present

## 2017-12-28 DIAGNOSIS — I82819 Embolism and thrombosis of superficial veins of unspecified lower extremities: Secondary | ICD-10-CM | POA: Diagnosis not present

## 2018-02-11 DIAGNOSIS — E785 Hyperlipidemia, unspecified: Secondary | ICD-10-CM | POA: Diagnosis not present

## 2018-02-11 DIAGNOSIS — I70203 Unspecified atherosclerosis of native arteries of extremities, bilateral legs: Secondary | ICD-10-CM | POA: Diagnosis not present

## 2018-02-11 DIAGNOSIS — Z23 Encounter for immunization: Secondary | ICD-10-CM | POA: Diagnosis not present

## 2018-02-11 DIAGNOSIS — E119 Type 2 diabetes mellitus without complications: Secondary | ICD-10-CM | POA: Diagnosis not present

## 2018-02-23 DIAGNOSIS — E785 Hyperlipidemia, unspecified: Secondary | ICD-10-CM | POA: Diagnosis not present

## 2018-02-23 DIAGNOSIS — R945 Abnormal results of liver function studies: Secondary | ICD-10-CM | POA: Diagnosis not present

## 2018-02-23 DIAGNOSIS — E119 Type 2 diabetes mellitus without complications: Secondary | ICD-10-CM | POA: Diagnosis not present

## 2018-02-23 DIAGNOSIS — I10 Essential (primary) hypertension: Secondary | ICD-10-CM | POA: Diagnosis not present

## 2018-03-02 DIAGNOSIS — E785 Hyperlipidemia, unspecified: Secondary | ICD-10-CM | POA: Diagnosis not present

## 2018-03-02 DIAGNOSIS — E009 Congenital iodine-deficiency syndrome, unspecified: Secondary | ICD-10-CM | POA: Diagnosis not present

## 2018-03-02 DIAGNOSIS — I10 Essential (primary) hypertension: Secondary | ICD-10-CM | POA: Diagnosis not present

## 2018-03-02 DIAGNOSIS — E119 Type 2 diabetes mellitus without complications: Secondary | ICD-10-CM | POA: Diagnosis not present

## 2018-03-28 DIAGNOSIS — J209 Acute bronchitis, unspecified: Secondary | ICD-10-CM | POA: Diagnosis not present

## 2018-04-16 DIAGNOSIS — R05 Cough: Secondary | ICD-10-CM | POA: Diagnosis not present

## 2018-04-16 DIAGNOSIS — I10 Essential (primary) hypertension: Secondary | ICD-10-CM | POA: Diagnosis not present

## 2018-04-16 DIAGNOSIS — J9801 Acute bronchospasm: Secondary | ICD-10-CM | POA: Diagnosis not present

## 2018-04-16 DIAGNOSIS — R0609 Other forms of dyspnea: Secondary | ICD-10-CM | POA: Diagnosis not present

## 2018-04-16 DIAGNOSIS — Z7901 Long term (current) use of anticoagulants: Secondary | ICD-10-CM | POA: Diagnosis not present

## 2018-04-16 DIAGNOSIS — H913 Deaf nonspeaking, not elsewhere classified: Secondary | ICD-10-CM | POA: Diagnosis not present

## 2018-04-16 DIAGNOSIS — R911 Solitary pulmonary nodule: Secondary | ICD-10-CM | POA: Diagnosis not present

## 2018-04-16 DIAGNOSIS — Z87891 Personal history of nicotine dependence: Secondary | ICD-10-CM | POA: Diagnosis not present

## 2018-04-16 DIAGNOSIS — E039 Hypothyroidism, unspecified: Secondary | ICD-10-CM | POA: Diagnosis not present

## 2018-04-16 DIAGNOSIS — J302 Other seasonal allergic rhinitis: Secondary | ICD-10-CM | POA: Diagnosis not present

## 2018-04-16 DIAGNOSIS — R600 Localized edema: Secondary | ICD-10-CM | POA: Diagnosis not present

## 2018-04-16 DIAGNOSIS — Z86718 Personal history of other venous thrombosis and embolism: Secondary | ICD-10-CM | POA: Diagnosis not present
